# Patient Record
Sex: Female | Born: 1985 | Race: White | Hispanic: No | Marital: Married | Smoking: Current every day smoker
Health system: Southern US, Community
[De-identification: ages and names within clinical notes are randomized; demographics above are authoritative.]

## PROBLEM LIST (undated history)

## (undated) ENCOUNTER — Inpatient Hospital Stay (HOSPITAL_COMMUNITY): Payer: Self-pay

## (undated) DIAGNOSIS — F419 Anxiety disorder, unspecified: Secondary | ICD-10-CM

## (undated) DIAGNOSIS — K759 Inflammatory liver disease, unspecified: Secondary | ICD-10-CM

## (undated) DIAGNOSIS — Z8742 Personal history of other diseases of the female genital tract: Secondary | ICD-10-CM

## (undated) DIAGNOSIS — Z8619 Personal history of other infectious and parasitic diseases: Secondary | ICD-10-CM

## (undated) DIAGNOSIS — R39198 Other difficulties with micturition: Secondary | ICD-10-CM

## (undated) DIAGNOSIS — R51 Headache: Secondary | ICD-10-CM

## (undated) DIAGNOSIS — A630 Anogenital (venereal) warts: Secondary | ICD-10-CM

## (undated) DIAGNOSIS — R519 Headache, unspecified: Secondary | ICD-10-CM

## (undated) DIAGNOSIS — F1911 Other psychoactive substance abuse, in remission: Secondary | ICD-10-CM

## (undated) HISTORY — DX: Anogenital (venereal) warts: A63.0

## (undated) HISTORY — DX: Personal history of other diseases of the female genital tract: Z87.42

---

## 2013-04-22 ENCOUNTER — Emergency Department (HOSPITAL_COMMUNITY)
Admission: EM | Admit: 2013-04-22 | Discharge: 2013-04-22 | Disposition: A | Discharge status: Home / Self Care | Payer: Self-pay | Attending: Emergency Medicine | Admitting: Emergency Medicine

## 2013-04-22 ENCOUNTER — Encounter (HOSPITAL_COMMUNITY): Payer: Self-pay | Admitting: Emergency Medicine

## 2013-04-22 DIAGNOSIS — L293 Anogenital pruritus, unspecified: Secondary | ICD-10-CM | POA: Insufficient documentation

## 2013-04-22 DIAGNOSIS — Z3202 Encounter for pregnancy test, result negative: Secondary | ICD-10-CM | POA: Insufficient documentation

## 2013-04-22 DIAGNOSIS — K0889 Other specified disorders of teeth and supporting structures: Secondary | ICD-10-CM

## 2013-04-22 DIAGNOSIS — Z792 Long term (current) use of antibiotics: Secondary | ICD-10-CM | POA: Insufficient documentation

## 2013-04-22 DIAGNOSIS — Z8659 Personal history of other mental and behavioral disorders: Secondary | ICD-10-CM | POA: Insufficient documentation

## 2013-04-22 DIAGNOSIS — Z79899 Other long term (current) drug therapy: Secondary | ICD-10-CM | POA: Insufficient documentation

## 2013-04-22 DIAGNOSIS — N760 Acute vaginitis: Secondary | ICD-10-CM | POA: Insufficient documentation

## 2013-04-22 DIAGNOSIS — B9689 Other specified bacterial agents as the cause of diseases classified elsewhere: Secondary | ICD-10-CM

## 2013-04-22 DIAGNOSIS — N390 Urinary tract infection, site not specified: Secondary | ICD-10-CM | POA: Insufficient documentation

## 2013-04-22 DIAGNOSIS — F17200 Nicotine dependence, unspecified, uncomplicated: Secondary | ICD-10-CM | POA: Insufficient documentation

## 2013-04-22 DIAGNOSIS — K089 Disorder of teeth and supporting structures, unspecified: Secondary | ICD-10-CM | POA: Insufficient documentation

## 2013-04-22 DIAGNOSIS — A499 Bacterial infection, unspecified: Secondary | ICD-10-CM | POA: Insufficient documentation

## 2013-04-22 DIAGNOSIS — R300 Dysuria: Secondary | ICD-10-CM | POA: Insufficient documentation

## 2013-04-22 HISTORY — DX: Other psychoactive substance abuse, in remission: F19.11

## 2013-04-22 LAB — URINALYSIS, ROUTINE W REFLEX MICROSCOPIC
Glucose, UA: NEGATIVE mg/dL
Ketones, ur: NEGATIVE mg/dL
pH: 7.5 (ref 5.0–8.0)

## 2013-04-22 LAB — WET PREP, GENITAL: Trich, Wet Prep: NONE SEEN

## 2013-04-22 LAB — URINE MICROSCOPIC-ADD ON

## 2013-04-22 MED ORDER — PENICILLIN V POTASSIUM 500 MG PO TABS: 500.0000 mg | ORAL_TABLET | Freq: Four times a day (QID) | ORAL | Status: AC

## 2013-04-22 MED ORDER — IBUPROFEN 600 MG PO TABS: 600.0000 mg | ORAL_TABLET | Freq: Four times a day (QID) | ORAL | Status: DC | PRN

## 2013-04-22 MED ORDER — METRONIDAZOLE 500 MG PO TABS: 500.0000 mg | ORAL_TABLET | Freq: Two times a day (BID) | ORAL | Status: DC

## 2013-04-22 NOTE — ED Provider Notes (Signed)
History    CSN: [id number] Arrival date & time 04/22/13  1335  First MD Initiated Contact with Patient 04/22/13 1409     Chief Complaint  Patient presents with  . Dental Pain  . Urinary Tract Infection    burning on urination   (Consider location/radiation/quality/duration/timing/severity/associated sxs/prior Treatment) The history is provided by the patient. No language interpreter was used.  Stacey Ayala is a 27 y/o F with PMHx of substance abuse in remission presenting to the ED with dental pain, dysuria, and vaginal pruritis. Patient reported that the dental pain has been ongoing for the past 2 days - affecting the left upper tooth - stated that she cracked her tooth approximately one year ago, described the pain to be a constant pressure sensation, pain worsens when chewing, reports pain to be relieved with Ibuprofen - discomfort radiates to left cheek and neck. Patient reported that she she has been experiencing dysuria and vaginal pruritis starting yesterday. Reported that she is sexually active and that she has not used protection - stated that her last encounter was last night. Denied difficulty swallowing, sore throat, blurred vision, visual distortions, chest pain, shortness of breath, difficulty breathing, numbness to face, tingling, abdominal pain, nausea, vomiting.  PCP none   Past Medical History  Diagnosis Date  . Substance abuse in remission    History reviewed. No pertinent past surgical history. Family History  Problem Relation Age of Onset  . Diabetes Mother   . Hypertension Mother    History  Substance Use Topics  . Smoking status: Current Every Day Smoker  . Smokeless tobacco: Not on file  . Alcohol Use: No   OB History   Grav Para Term Preterm Abortions TAB SAB Ect Mult Living                 Review of Systems  Constitutional: Negative for fever and chills.  HENT: Positive for dental problem. Negative for neck pain.   Eyes: Negative for visual  disturbance.  Respiratory: Negative for chest tightness and shortness of breath.   Cardiovascular: Negative for chest pain.  Gastrointestinal: Negative for nausea, vomiting and abdominal pain.  Genitourinary: Positive for dysuria. Negative for decreased urine volume, vaginal bleeding, vaginal discharge, vaginal pain and pelvic pain.  Musculoskeletal: Negative for back pain.  Neurological: Negative for dizziness, weakness, light-headedness and headaches.  All other systems reviewed and are negative.    Allergies  Review of patient's allergies indicates no known allergies.  Home Medications   Current Outpatient Rx  Name  Route  Sig  Dispense  Refill  . ibuprofen (ADVIL,MOTRIN) 800 MG tablet   Oral   Take 800 mg by mouth every 8 (eight) hours as needed (For tooth pain.).         Marland Kitchen ibuprofen (ADVIL,MOTRIN) 600 MG tablet   Oral   Take 1 tablet (600 mg total) by mouth every 6 (six) hours as needed for pain.   30 tablet   0   . metroNIDAZOLE (FLAGYL) 500 MG tablet   Oral   Take 1 tablet (500 mg total) by mouth 2 (two) times daily. One po bid x 7 days   14 tablet   0   . penicillin v potassium (VEETID) 500 MG tablet   Oral   Take 1 tablet (500 mg total) by mouth 4 (four) times daily.   40 tablet   0    BP 119/72  Pulse 89  Temp(Src) 98 F (36.7 C) (Oral)  Resp 16  SpO2 100%  LMP 04/08/2013 Physical Exam  Nursing note and vitals reviewed. Constitutional: She is oriented to person, place, and time. She appears well-developed and well-nourished. No distress.  HENT:  Head: Normocephalic and atraumatic.  Mouth/Throat: Oropharynx is clear and moist.    Mild facial swelling noted to the left side of the face Mild discomfort noted to the left side of the face - maxillary and mandibular upon palpation  Uvula midline, symmetrical elevation.    Mouth: Negative swelling, erythema, inflammation, lesions, sores noted to the buccal mucosa and gums. Poor dentition noted -  decayed and darkened teeth to upper and lower jaw. Negative swelling to the gums. Negative active drainage. Negative abscess and cyst formation, negative trismus. Negative sublingual lesion.   Eyes: Conjunctivae and EOM are normal. Pupils are equal, round, and reactive to light. Right eye exhibits no discharge. Left eye exhibits no discharge.  Neck: Normal range of motion. Neck supple.  Negative neck stiffness Negative nuchal rigidity  Cardiovascular: Normal rate, regular rhythm and normal heart sounds.  Exam reveals no friction rub.   No murmur heard. Pulses:      Radial pulses are 2+ on the right side, and 2+ on the left side.  Pulmonary/Chest: Effort normal and breath sounds normal. No respiratory distress. She has no wheezes. She has no rales.  Genitourinary: Vagina normal.  Speculum: Negative swelling, erythema, inflammation, lesions, sores noted to the external genitalia. Negative lesions, sores, inflammation, swelling, erythema noted to the vaginal walls. Thick, white discharge noted - yeast most likely. Negative erythema, inflammation, strawberry appearance to the cervix noted.  Pelvic: Negative CMT. Negative adnexal tenderness bilaterally.   Lymphadenopathy:    She has no cervical adenopathy.  Neurological: She is alert and oriented to person, place, and time. No cranial nerve deficit. She exhibits normal muscle tone. Coordination normal.  Cranial nerves III-XII grossly intact  Skin: Skin is warm and dry. No rash noted. She is not diaphoretic. No erythema.  Psychiatric: She has a normal mood and affect. Her behavior is normal. Thought content normal.    ED Course  Procedures (including critical care time) Labs Reviewed  WET PREP, GENITAL - Abnormal; Notable for the following:    Yeast Wet Prep HPF POC FEW (*)    Clue Cells Wet Prep HPF POC MANY (*)    WBC, Wet Prep HPF POC MANY (*)    All other components within normal limits  URINALYSIS, ROUTINE W REFLEX MICROSCOPIC -  Abnormal; Notable for the following:    APPearance TURBID (*)    Leukocytes, UA SMALL (*)    All other components within normal limits  URINE MICROSCOPIC-ADD ON - Abnormal; Notable for the following:    Squamous Epithelial / LPF MANY (*)    All other components within normal limits  GC/CHLAMYDIA PROBE AMP  POCT PREGNANCY, URINE   No results found. 1. Pain, dental   2. Bacterial vaginosis     MDM  Patient presenting to the ED with dental pain x 2 days and dysuria with vaginal pruritis starting yesterday. Mild facial swelling noted to the left side of the face. Negative abscess formation, cyst formation. Negative peritonsillar abscess noted - negative posterior oropharynx swelling. Negative sublingual lesion, negative trismus - doubt Ludwig's angina. UA negative for infection. Wet prep positive for many clue cells - bacterial vaginosis. Patient stable, afebrile. Dental pain discharged with penicillin and anti-inflammatories - patient requested no narcotics due to being in remission. Bacterial vaginosis discharged with flagyl. Referred patient to  dentist, oral surgeon, women's outpatient clinic - resource guide given. Discussed with patient to rest and stay hydrated. Discussed with patient to avoid sexual encounters - recommended patient get STD testing further. Discussed with patient to use protection. Discussed with patient to continue to monitor symptoms and if symptoms are to worsen or change to report back to the ED - strict return instructions given.  Patient agreed to plan of care, understood, all questions answered.   Raymon Mutton, PA-C 04/22/13 2217

## 2013-04-22 NOTE — ED Notes (Signed)
Pain to lt side of face for the past few days with tooth pain, states that she has some swelling to lt side of face also. Denies any injury.

## 2013-04-22 NOTE — Progress Notes (Signed)
[facility] has seen patient and provided her with a list of primary care resources. °

## 2013-04-23 LAB — GC/CHLAMYDIA PROBE AMP: GC Probe RNA: NEGATIVE

## 2013-04-30 NOTE — ED Provider Notes (Signed)
Medical screening examination/treatment/procedure(s) were performed by non-physician practitioner and as supervising physician I was immediately available for consultation/collaboration.  Toy Baker, MD 04/30/13 [phone]

## 2014-08-06 ENCOUNTER — Emergency Department (HOSPITAL_COMMUNITY): Payer: Self-pay

## 2014-08-06 ENCOUNTER — Emergency Department (HOSPITAL_COMMUNITY)
Admission: EM | Admit: 2014-08-06 | Discharge: 2014-08-06 | Disposition: A | Discharge status: Home / Self Care | Payer: Self-pay | Attending: Emergency Medicine | Admitting: Emergency Medicine

## 2014-08-06 ENCOUNTER — Encounter (HOSPITAL_COMMUNITY): Payer: Self-pay | Admitting: Emergency Medicine

## 2014-08-06 DIAGNOSIS — R102 Pelvic and perineal pain: Secondary | ICD-10-CM | POA: Insufficient documentation

## 2014-08-06 DIAGNOSIS — Z720 Tobacco use: Secondary | ICD-10-CM | POA: Insufficient documentation

## 2014-08-06 DIAGNOSIS — R109 Unspecified abdominal pain: Secondary | ICD-10-CM

## 2014-08-06 DIAGNOSIS — Z3202 Encounter for pregnancy test, result negative: Secondary | ICD-10-CM | POA: Insufficient documentation

## 2014-08-06 DIAGNOSIS — Z792 Long term (current) use of antibiotics: Secondary | ICD-10-CM | POA: Insufficient documentation

## 2014-08-06 LAB — URINALYSIS, ROUTINE W REFLEX MICROSCOPIC
Bilirubin Urine: NEGATIVE
GLUCOSE, UA: NEGATIVE mg/dL
KETONES UR: NEGATIVE mg/dL
LEUKOCYTES UA: NEGATIVE
NITRITE: NEGATIVE
PH: 5.5 (ref 5.0–8.0)
Protein, ur: NEGATIVE mg/dL
SPECIFIC GRAVITY, URINE: 1.027 (ref 1.005–1.030)
Urobilinogen, UA: 0.2 mg/dL (ref 0.0–1.0)

## 2014-08-06 LAB — CBC WITH DIFFERENTIAL/PLATELET
BASOS ABS: 0 10*3/uL (ref 0.0–0.1)
BASOS PCT: 0 % (ref 0–1)
EOS ABS: 0 10*3/uL (ref 0.0–0.7)
EOS PCT: 0 % (ref 0–5)
HCT: 36.8 % (ref 36.0–46.0)
Hemoglobin: 12.6 g/dL (ref 12.0–15.0)
LYMPHS ABS: 1.6 10*3/uL (ref 0.7–4.0)
Lymphocytes Relative: 12 % (ref 12–46)
MCH: 29.9 pg (ref 26.0–34.0)
MCHC: 34.2 g/dL (ref 30.0–36.0)
MCV: 87.4 fL (ref 78.0–100.0)
Monocytes Absolute: 0.4 10*3/uL (ref 0.1–1.0)
Monocytes Relative: 3 % (ref 3–12)
NEUTROS PCT: 85 % — AB (ref 43–77)
Neutro Abs: 10.9 10*3/uL — ABNORMAL HIGH (ref 1.7–7.7)
PLATELETS: 248 10*3/uL (ref 150–400)
RBC: 4.21 MIL/uL (ref 3.87–5.11)
RDW: 12.9 % (ref 11.5–15.5)
WBC: 12.9 10*3/uL — ABNORMAL HIGH (ref 4.0–10.5)

## 2014-08-06 LAB — COMPREHENSIVE METABOLIC PANEL
ALBUMIN: 3.9 g/dL (ref 3.5–5.2)
ALK PHOS: 44 U/L (ref 39–117)
ALT: 15 U/L (ref 0–35)
AST: 18 U/L (ref 0–37)
Anion gap: 12 (ref 5–15)
BUN: 14 mg/dL (ref 6–23)
CALCIUM: 8.9 mg/dL (ref 8.4–10.5)
CO2: 21 mEq/L (ref 19–32)
Chloride: 105 mEq/L (ref 96–112)
Creatinine, Ser: 0.77 mg/dL (ref 0.50–1.10)
GFR calc non Af Amer: >90 mL/min (ref >90)
Glucose, Bld: 89 mg/dL (ref 70–99)
POTASSIUM: 4.3 meq/L (ref 3.7–5.3)
SODIUM: 138 meq/L (ref 137–147)
TOTAL PROTEIN: 7.2 g/dL (ref 6.0–8.3)
Total Bilirubin: 0.4 mg/dL (ref 0.3–1.2)

## 2014-08-06 LAB — URINE MICROSCOPIC-ADD ON

## 2014-08-06 LAB — LIPASE, BLOOD: Lipase: 18 U/L (ref 11–59)

## 2014-08-06 LAB — WET PREP, GENITAL
CLUE CELLS WET PREP: NONE SEEN
Trich, Wet Prep: NONE SEEN
YEAST WET PREP: NONE SEEN

## 2014-08-06 LAB — POC URINE PREG, ED: PREG TEST UR: NEGATIVE

## 2014-08-06 MED ORDER — MORPHINE SULFATE 4 MG/ML IJ SOLN
4.0000 mg | Freq: Once | INTRAMUSCULAR | Status: DC
  Filled 2014-08-06: qty 1

## 2014-08-06 MED ORDER — MORPHINE SULFATE 4 MG/ML IJ SOLN
4.0000 mg | Freq: Once | INTRAMUSCULAR | Status: AC
  Administered 2014-08-06: 4 mg via INTRAMUSCULAR
  Filled 2014-08-06: qty 1

## 2014-08-06 MED ORDER — IBUPROFEN 800 MG PO TABS: 800.0000 mg | ORAL_TABLET | Freq: Three times a day (TID) | ORAL | Status: DC | PRN

## 2014-08-06 MED ORDER — ONDANSETRON HCL 4 MG PO TABS: 4.0000 mg | ORAL_TABLET | Freq: Four times a day (QID) | ORAL | Status: DC

## 2014-08-06 MED ORDER — NICOTINE 21 MG/24HR TD PT24
21.0000 mg | MEDICATED_PATCH | Freq: Once | TRANSDERMAL | Status: DC
  Administered 2014-08-06: 21 mg via TRANSDERMAL
  Filled 2014-08-06: qty 1

## 2014-08-06 MED ORDER — ONDANSETRON 4 MG PO TBDP
4.0000 mg | ORAL_TABLET | Freq: Once | ORAL | Status: AC
  Administered 2014-08-06: 4 mg via ORAL
  Filled 2014-08-06: qty 1

## 2014-08-06 MED ORDER — DICYCLOMINE HCL 20 MG PO TABS: 20.0000 mg | ORAL_TABLET | Freq: Two times a day (BID) | ORAL | Status: DC

## 2014-08-06 MED ORDER — MORPHINE SULFATE 4 MG/ML IJ SOLN: 4.0000 mg | Freq: Once | INTRAMUSCULAR | Status: DC

## 2014-08-06 MED ORDER — IOHEXOL 300 MG/ML  SOLN
25.0000 mL | Freq: Once | INTRAMUSCULAR | Status: AC | PRN
  Administered 2014-08-06: 25 mL via ORAL

## 2014-08-06 MED ORDER — KETOROLAC TROMETHAMINE 30 MG/ML IJ SOLN
60.0000 mg | Freq: Once | INTRAMUSCULAR | Status: AC
  Administered 2014-08-06: 60 mg via INTRAMUSCULAR
  Filled 2014-08-06: qty 2

## 2014-08-06 MED ORDER — SODIUM CHLORIDE 0.9 % IV BOLUS (SEPSIS): 1000.0000 mL | Freq: Once | INTRAVENOUS | Status: DC

## 2014-08-06 NOTE — ED Notes (Signed)
ermd attempted iv by us to rt ac without success and to rt ej   Without success ct tech called and informed no iv line

## 2014-08-06 NOTE — ED Notes (Signed)
Pt requesting addl pain med

## 2014-08-06 NOTE — ED Notes (Signed)
Stacey Ayala states she will insert ultrasound iv line

## 2014-08-06 NOTE — ED Notes (Signed)
Left sided abd pain since 4 30 am and has vomited multiple times on her period now denies vagd d/c and dysuria

## 2014-08-06 NOTE — ED Notes (Signed)
Attempted iv to rt ac with 20g unable to advance cath dcd intact and dsd to site

## 2014-08-06 NOTE — ED Notes (Signed)
Pt requesting pain med md informed writer offered pt tylenol or tramadol as md requested and pt stated "i want something stronger. i talked to my sponsor and they said i could have something in the er but not go home with it. i'm not looking to get high i just hurt and need something stronger" dr.ward informed and will speak with pt

## 2014-08-06 NOTE — ED Notes (Signed)
pt remains in us

## 2014-08-06 NOTE — ED Provider Notes (Signed)
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 She reports she is currently on her menstrual cycle. She has never had similar pain before. Denies any fevers or chills but also has had some diarrhea. No dysuria or vaginal discharge. No prior history of abdominal surgery. No sick contacts or recent travel. She is sexually active with one partner and denies a history of prior STDs. She denies being pregnant before.  ROS: See HPI Constitutional: no fever  Eyes: no drainage  ENT: no runny nose   Cardiovascular:  no chest pain  Resp: no SOB  GI:  vomiting GU: no dysuria Integumentary: no rash  Allergy: no hives  Musculoskeletal: no leg swelling  Neurological: no slurred speech ROS otherwise negative  PAST MEDICAL HISTORY/PAST SURGICAL HISTORY:  Past Medical History  Diagnosis Date  . Substance abuse in remission     MEDICATIONS:  Prior to Admission medications   Medication Sig Start Date End Date Taking? Authorizing Provider  ibuprofen (ADVIL,MOTRIN) 600 MG tablet Take 1 tablet (600 mg total) by mouth every 6 (six) hours as needed for pain. 04/22/13   Marissa Sciacca, PA-C  ibuprofen (ADVIL,MOTRIN) 800 MG tablet Take 800 mg by mouth every 8 (eight) hours as needed (For tooth pain.).    Historical Provider, MD  metroNIDAZOLE (FLAGYL) 500 MG tablet Take 1 tablet (500 mg total) by mouth 2 (two) times daily. One po bid x 7 days 04/22/13   Raymon MuttonMarissa Sciacca, PA-C    ALLERGIES:  No Known Allergies  SOCIAL HISTORY:  History  Substance Use Topics  . Smoking status: Current Every Day Smoker  . Smokeless tobacco: Not on file  . Alcohol Use: No    FAMILY HISTORY: Family History  Problem Relation Age of Onset  . Diabetes Mother    . Hypertension Mother     EXAM: BP 112/56 mmHg  Pulse 77  Temp(Src) 97.8 F (36.6 C) (Oral)  Resp 16  SpO2 99% CONSTITUTIONAL: Alert and oriented and responds appropriately to questions. Well-appearing; well-nourished HEAD: Normocephalic EYES: Conjunctivae clear, PERRL ENT: normal nose; no rhinorrhea; moist mucous membranes; pharynx without lesions noted NECK: Supple, no meningismus, no LAD  CARD: RRR; S1 and S2 appreciated; no murmurs, no clicks, no rubs, no gallops RESP: Normal chest excursion without splinting or tachypnea; breath sounds clear and equal bilaterally; no wheezes, no rhonchi, no rales,  ABD/GI: Normal bowel sounds; non-distended; soft, tender to palpation in the left lower pelvic region without guarding or rebound, no peritoneal signs GU:  Normal external genitalia, mild amount of dark red blood coming from the cervical os, no vaginal discharge, no right adnexal tenderness or fullness, no cervical motion tenderness, patient does have left adnexal tenderness without fullness BACK:  The back appears normal and is non-tender to palpation, there is no CVA tenderness EXT: Normal ROM in all joints; non-tender to palpation; no edema; normal capillary refill; no cyanosis    SKIN: Normal color for age and race; warm NEURO: Moves all extremities equally PSYCH: The patient's mood and manner are appropriate. Grooming and personal hygiene are appropriate.  MEDICAL DECISION MAKING: Pt here with left pelvic pain. Differential diagnosis includes ovarian cyst, torsion, TOA. Pregnancy test is negative so I doubt that this is an  ectopic. Less likely colitis. Gastroenteritis is also the differential given her vomiting and diarrhea. Given she is a prior heroin addict, she is requesting that we try to avoid narcotic medications. We'll give IM Toradol. We'll give oral Zofran. We'll obtain labs, urinalysis, pelvic exam with cultures and transvaginal Doppler.  ED PROGRESS: Pt's wet prep shows  numerous white blood cells but no clue cells, yeast or trichomonas. Gonorrhea and chlamydia cultures are pending. She is not concerned for STD exposure and I do not feel she needs prophylactic treatment based on her exam. She is complaining of worsening pain and does have a leukocytosis with left shift. On repeat examination, she now has right lower quadrant pain with some voluntary guarding. Concern for possible appendicitis. Her transvaginal ultrasound has been normal. We'll proceed with a CT of her abdomen and pelvis. Patient is requesting something stronger for pain. She reports she has talked to her sponsor who she is on the phone with my [employer] and they state that they feel it is okay for her to have something for pain control in the emergency department as long as she is not discharged with any narcotic. We have discussed this at length at bedside and given patient appears uncomfortable I feel is appropriate to treat her pain in the ED.   4:00 PM  Pt's CT shows no acute abnormality to explain her pain. She does have a small pulmonary nodule that will need follow-up in one year. Discussed this with patient. We'll discharge her home with prescription for Zofran, Bentyl, ibuprofen. Possible gastroenteritis. Discussed return precautions. Patient comfortable with plan.  Layla MawKristen N Lawsyn Heiler, DO 08/06/14 1601

## 2014-08-06 NOTE — Discharge Instructions (Signed)
You have a 7.5 mm left-sided pulmonary nodule that needs to be followed by imaging every 12 months.    Viral Gastroenteritis Viral gastroenteritis is also known as stomach flu. This condition affects the stomach and intestinal tract. It can cause sudden diarrhea and vomiting. The illness typically lasts 3 to 8 days. Most people develop an immune response that eventually gets rid of the virus. While this natural response develops, the virus can make you quite ill. CAUSES  Many different viruses can cause gastroenteritis, such as rotavirus or noroviruses. You can catch one of these viruses by consuming contaminated food or water. You may also catch a virus by sharing utensils or other personal items with an infected person or by touching a contaminated surface. SYMPTOMS  The most common symptoms are diarrhea and vomiting. These problems can cause a severe loss of body fluids (dehydration) and a body salt (electrolyte) imbalance. Other symptoms may include:  Fever.  Headache.  Fatigue.  Abdominal pain. DIAGNOSIS  Your caregiver can usually diagnose viral gastroenteritis based on your symptoms and a physical exam. A stool sample may also be taken to test for the presence of viruses or other infections. TREATMENT  This illness typically goes away on its own. Treatments are aimed at rehydration. The most serious cases of viral gastroenteritis involve vomiting so severely that you are not able to keep fluids down. In these cases, fluids must be given through an intravenous line (IV). HOME CARE INSTRUCTIONS   Drink enough fluids to keep your urine clear or pale yellow. Drink small amounts of fluids frequently and increase the amounts as tolerated.  Ask your caregiver for specific rehydration instructions.  Avoid:  Foods high in sugar.  Alcohol.  Carbonated drinks.  Tobacco.  Juice.  Caffeine drinks.  Extremely hot or cold fluids.  Fatty, greasy foods.  Too much intake of  anything at one time.  Dairy products until 24 to 48 hours after diarrhea stops.  You may consume probiotics. Probiotics are active cultures of beneficial bacteria. They may lessen the amount and number of diarrheal stools in adults. Probiotics can be found in yogurt with active cultures and in supplements.  Wash your hands well to avoid spreading the virus.  Only take over-the-counter or prescription medicines for pain, discomfort, or fever as directed by your caregiver. Do not give aspirin to children. Antidiarrheal medicines are not recommended.  Ask your caregiver if you should continue to take your regular prescribed and over-the-counter medicines.  Keep all follow-up appointments as directed by your caregiver. SEEK IMMEDIATE MEDICAL CARE IF:   You are unable to keep fluids down.  You do not urinate at least once every 6 to 8 hours.  You develop shortness of breath.  You notice blood in your stool or vomit. This may look like coffee grounds.  You have abdominal pain that increases or is concentrated in one small area (localized).  You have persistent vomiting or diarrhea.  You have a fever.  The patient is a child younger than 3 months, and he or she has a fever.  The patient is a child older than 3 months, and he or she has a fever and persistent symptoms.  The patient is a child older than 3 months, and he or she has a fever and symptoms suddenly get worse.  The patient is a baby, and he or she has no tears when crying. MAKE SURE YOU:   Understand these instructions.  Will watch your condition.  Will get  help right away if you are not doing well or get worse. Document Released: 09/18/2005 Document Revised: 12/11/2011 Document Reviewed: 07/05/2011 [facility] Patient Information 2015 [place], [place]. This information is not intended to replace advice given to you by your health care provider. Make sure you discuss any questions you have with your health care  provider.   Pulmonary Nodule A pulmonary nodule is a small, round growth of tissue in the lung. Pulmonary nodules can range in size from less than 1/5 inch (4 mm) to a little bigger than an inch (25 mm). Most pulmonary nodules are detected when imaging tests of the lung are being performed for a different problem. Pulmonary nodules are usually not cancerous (benign). However, some pulmonary nodules are cancerous (malignant). Follow-up treatment or testing is based on the size of the pulmonary nodule and your risk of getting lung cancer.  CAUSES Benign pulmonary nodules can be caused by various things. Some of the causes include:   Bacterial, fungal, or viral infections. This is usually an old infection that is no longer active, but it can sometimes be a current, active infection.  A benign mass of tissue.  Inflammation from conditions such as rheumatoid arthritis.   Abnormal blood vessels in the lungs. Malignant pulmonary nodules can result from lung cancer or from cancers that spread to the lung from other places in the body. SIGNS AND SYMPTOMS Pulmonary nodules usually do not cause symptoms. DIAGNOSIS Most often, pulmonary nodules are found incidentally when an X-ray or CT scan is performed to look for some other problem in the lung area. To help determine whether a pulmonary nodule is benign or malignant, your health care provider will take a medical history and order a variety of tests. Tests done may include:   Blood tests.  A skin test called a tuberculin test. This test is used to determine if you have been exposed to the germ that causes tuberculosis.   Chest X-rays. If possible, a new X-ray may be compared with X-rays you have had in the past.   CT scan. This test shows smaller pulmonary nodules more clearly than an X-ray.   Positron emission tomography (PET) scan. In this test, a safe amount of a radioactive substance is injected into the bloodstream. Then, the scan takes  a picture of the pulmonary nodule. The radioactive substance is eliminated from your body in your urine.   Biopsy. A tiny piece of the pulmonary nodule is removed so it can be checked under a microscope. TREATMENT  Pulmonary nodules that are benign normally do not require any treatment because they usually do not cause symptoms or breathing problems. Your health care provider may want to monitor the pulmonary nodule through follow-up CT scans. The frequency of these CT scans will vary based on the size of the nodule and the risk factors for lung cancer. For example, CT scans will need to be done more frequently if the pulmonary nodule is larger and if you have a history of smoking and a family history of cancer. Further testing or biopsies may be done if any follow-up CT scan shows that the size of the pulmonary nodule has increased. HOME CARE INSTRUCTIONS  Only take over-the-counter or prescription medicines as directed by your health care provider.  Keep all follow-up appointments with your health care provider. SEEK MEDICAL CARE IF:  You have trouble breathing when you are active.   You feel sick or unusually tired.   You do not feel like eating.  You lose weight without trying to.   You develop chills or night sweats.  SEEK IMMEDIATE MEDICAL CARE IF:  You cannot catch your breath, or you begin wheezing.   You cannot stop coughing.   You cough up blood.   You become dizzy or feel like you are going to pass out.   You have sudden chest pain.   You have a fever or persistent symptoms for more than 2-3 days.   You have a fever and your symptoms suddenly get worse. MAKE SURE YOU:  Understand these instructions.  Will watch your condition.  Will get help right away if you are not doing well or get worse. Document Released: 07/16/2009 Document Revised: 05/21/2013 Document Reviewed: 03/10/2013 [facility] Patient Information 2015 [place]. This information is  not intended to replace advice given to you by your health care provider. Make sure you discuss any questions you have with your health care provider.

## 2014-08-07 LAB — GC/CHLAMYDIA PROBE AMP
CT Probe RNA: NEGATIVE
GC Probe RNA: NEGATIVE

## 2014-10-11 ENCOUNTER — Emergency Department (HOSPITAL_COMMUNITY)
Admission: EM | Admit: 2014-10-11 | Discharge: 2014-10-11 | Disposition: A | Discharge status: Home / Self Care | Payer: Self-pay | Attending: Emergency Medicine | Admitting: Emergency Medicine

## 2014-10-11 ENCOUNTER — Encounter (HOSPITAL_COMMUNITY): Payer: Self-pay | Admitting: *Deleted

## 2014-10-11 DIAGNOSIS — Z79899 Other long term (current) drug therapy: Secondary | ICD-10-CM | POA: Insufficient documentation

## 2014-10-11 DIAGNOSIS — R1084 Generalized abdominal pain: Secondary | ICD-10-CM

## 2014-10-11 DIAGNOSIS — Z792 Long term (current) use of antibiotics: Secondary | ICD-10-CM | POA: Insufficient documentation

## 2014-10-11 DIAGNOSIS — F419 Anxiety disorder, unspecified: Secondary | ICD-10-CM | POA: Insufficient documentation

## 2014-10-11 DIAGNOSIS — N390 Urinary tract infection, site not specified: Secondary | ICD-10-CM | POA: Insufficient documentation

## 2014-10-11 DIAGNOSIS — Z720 Tobacco use: Secondary | ICD-10-CM | POA: Insufficient documentation

## 2014-10-11 DIAGNOSIS — R197 Diarrhea, unspecified: Secondary | ICD-10-CM | POA: Insufficient documentation

## 2014-10-11 DIAGNOSIS — Z3202 Encounter for pregnancy test, result negative: Secondary | ICD-10-CM | POA: Insufficient documentation

## 2014-10-11 HISTORY — DX: Anxiety disorder, unspecified: F41.9

## 2014-10-11 LAB — URINALYSIS, ROUTINE W REFLEX MICROSCOPIC
GLUCOSE, UA: NEGATIVE mg/dL
Hgb urine dipstick: NEGATIVE
Ketones, ur: 15 mg/dL — AB
Nitrite: POSITIVE — AB
PROTEIN: NEGATIVE mg/dL
Specific Gravity, Urine: 1.019 (ref 1.005–1.030)
UROBILINOGEN UA: 4 mg/dL — AB (ref 0.0–1.0)
pH: 5 (ref 5.0–8.0)

## 2014-10-11 LAB — COMPREHENSIVE METABOLIC PANEL
ALBUMIN: 4.5 g/dL (ref 3.5–5.2)
ALT: 24 U/L (ref 0–35)
AST: 23 U/L (ref 0–37)
Alkaline Phosphatase: 39 U/L (ref 39–117)
Anion gap: 8 (ref 5–15)
BUN: 12 mg/dL (ref 6–23)
CO2: 23 mmol/L (ref 19–32)
CREATININE: 0.83 mg/dL (ref 0.50–1.10)
Calcium: 9.3 mg/dL (ref 8.4–10.5)
Chloride: 106 mEq/L (ref 96–112)
GFR calc non Af Amer: >90 mL/min (ref >90)
GLUCOSE: 82 mg/dL (ref 70–99)
POTASSIUM: 4.3 mmol/L (ref 3.5–5.1)
SODIUM: 137 mmol/L (ref 135–145)
Total Bilirubin: 0.8 mg/dL (ref 0.3–1.2)
Total Protein: 7.6 g/dL (ref 6.0–8.3)

## 2014-10-11 LAB — CBC WITH DIFFERENTIAL/PLATELET
Basophils Absolute: 0 10*3/uL (ref 0.0–0.1)
Basophils Relative: 0 % (ref 0–1)
EOS PCT: 2 % (ref 0–5)
Eosinophils Absolute: 0.2 10*3/uL (ref 0.0–0.7)
HCT: 38.2 % (ref 36.0–46.0)
Hemoglobin: 13.2 g/dL (ref 12.0–15.0)
Lymphocytes Relative: 23 % (ref 12–46)
Lymphs Abs: 2.5 10*3/uL (ref 0.7–4.0)
MCH: 30.2 pg (ref 26.0–34.0)
MCHC: 34.6 g/dL (ref 30.0–36.0)
MCV: 87.4 fL (ref 78.0–100.0)
MONO ABS: 0.9 10*3/uL (ref 0.1–1.0)
MONOS PCT: 9 % (ref 3–12)
NEUTROS ABS: 7.2 10*3/uL (ref 1.7–7.7)
Neutrophils Relative %: 66 % (ref 43–77)
Platelets: 249 10*3/uL (ref 150–400)
RBC: 4.37 MIL/uL (ref 3.87–5.11)
RDW: 12.5 % (ref 11.5–15.5)
WBC: 10.8 10*3/uL — AB (ref 4.0–10.5)

## 2014-10-11 LAB — URINE MICROSCOPIC-ADD ON

## 2014-10-11 LAB — LIPASE, BLOOD: LIPASE: 22 U/L (ref 11–59)

## 2014-10-11 LAB — POC URINE PREG, ED: Preg Test, Ur: NEGATIVE

## 2014-10-11 MED ORDER — DIPHENOXYLATE-ATROPINE 2.5-0.025 MG PO TABS: 1.0000 | ORAL_TABLET | Freq: Four times a day (QID) | ORAL | Status: DC | PRN

## 2014-10-11 MED ORDER — CIPROFLOXACIN HCL 500 MG PO TABS: 500.0000 mg | ORAL_TABLET | Freq: Two times a day (BID) | ORAL | Status: DC

## 2014-10-11 MED ORDER — KETOROLAC TROMETHAMINE 30 MG/ML IJ SOLN
30.0000 mg | Freq: Once | INTRAMUSCULAR | Status: AC
  Administered 2014-10-11: 30 mg via INTRAVENOUS
  Filled 2014-10-11: qty 1

## 2014-10-11 MED ORDER — CIPROFLOXACIN HCL 500 MG PO TABS
500.0000 mg | ORAL_TABLET | Freq: Once | ORAL | Status: AC
  Administered 2014-10-11: 500 mg via ORAL
  Filled 2014-10-11: qty 1

## 2014-10-11 MED ORDER — MORPHINE SULFATE 4 MG/ML IJ SOLN
4.0000 mg | INTRAMUSCULAR | Status: DC | PRN
  Administered 2014-10-11: 4 mg via INTRAVENOUS
  Filled 2014-10-11: qty 1

## 2014-10-11 MED ORDER — DIPHENOXYLATE-ATROPINE 2.5-0.025 MG PO TABS
2.0000 | ORAL_TABLET | Freq: Once | ORAL | Status: AC
  Administered 2014-10-11: 2 via ORAL
  Filled 2014-10-11: qty 2

## 2014-10-11 MED ORDER — ONDANSETRON HCL 4 MG/2ML IJ SOLN
4.0000 mg | Freq: Once | INTRAMUSCULAR | Status: AC
  Administered 2014-10-11: 4 mg via INTRAVENOUS
  Filled 2014-10-11: qty 2

## 2014-10-11 MED ORDER — DICYCLOMINE HCL 20 MG PO TABS: 20.0000 mg | ORAL_TABLET | Freq: Two times a day (BID) | ORAL | Status: DC

## 2014-10-11 NOTE — Discharge Instructions (Signed)
Abdominal Pain, Women °Abdominal (stomach, pelvic, or belly) pain can be caused by many things. It is important to tell your doctor: °· The location of the pain. °· Does it come and go or is it present all the time? °· Are there things that start the pain (eating certain foods, exercise)? °· Are there other symptoms associated with the pain (fever, nausea, vomiting, diarrhea)? °All of this is helpful to know when trying to find the cause of the pain. °CAUSES  °· Stomach: virus or bacteria infection, or ulcer. °· Intestine: appendicitis (inflamed appendix), regional ileitis (Crohn's disease), ulcerative colitis (inflamed colon), irritable bowel syndrome, diverticulitis (inflamed diverticulum of the colon), or cancer of the stomach or intestine. °· Gallbladder disease or stones in the gallbladder. °· Kidney disease, kidney stones, or infection. °· Pancreas infection or cancer. °· Fibromyalgia (pain disorder). °· Diseases of the female organs: °¨ Uterus: fibroid (non-cancerous) tumors or infection. °¨ Fallopian tubes: infection or tubal pregnancy. °¨ Ovary: cysts or tumors. °¨ Pelvic adhesions (scar tissue). °¨ Endometriosis (uterus lining tissue growing in the pelvis and on the pelvic organs). °¨ Pelvic congestion syndrome (female organs filling up with blood just before the menstrual period). °¨ Pain with the menstrual period. °¨ Pain with ovulation (producing an egg). °¨ Pain with an IUD (intrauterine device, birth control) in the uterus. °¨ Cancer of the female organs. °· Functional pain (pain not caused by a disease, may improve without treatment). °· Psychological pain. °· Depression. °DIAGNOSIS  °Your doctor will decide the seriousness of your pain by doing an examination. °· Blood tests. °· X-rays. °· Ultrasound. °· CT scan (computed tomography, special type of X-ray). °· MRI (magnetic resonance imaging). °· Cultures, for infection. °· Barium enema (dye inserted in the large intestine, to better view it with  X-rays). °· Colonoscopy (looking in intestine with a lighted tube). °· Laparoscopy (minor surgery, looking in abdomen with a lighted tube). °· Major abdominal exploratory surgery (looking in abdomen with a large incision). °TREATMENT  °The treatment will depend on the cause of the pain.  °· Many cases can be observed and treated at home. °· Over-the-counter medicines recommended by your caregiver. °· Prescription medicine. °· Antibiotics, for infection. °· Birth control pills, for painful periods or for ovulation pain. °· Hormone treatment, for endometriosis. °· Nerve blocking injections. °· Physical therapy. °· Antidepressants. °· Counseling with a psychologist or psychiatrist. °· Minor or major surgery. °HOME CARE INSTRUCTIONS  °· Do not take laxatives, unless directed by your caregiver. °· Take over-the-counter pain medicine only if ordered by your caregiver. Do not take aspirin because it can cause an upset stomach or bleeding. °· Try a clear liquid diet (broth or water) as ordered by your caregiver. Slowly move to a bland diet, as tolerated, if the pain is related to the stomach or intestine. °· Have a thermometer and take your temperature several times a day, and record it. °· Bed rest and sleep, if it helps the pain. °· Avoid sexual intercourse, if it causes pain. °· Avoid stressful situations. °· Keep your follow-up appointments and tests, as your caregiver orders. °· If the pain does not go away with medicine or surgery, you may try: °¨ Acupuncture. °¨ Relaxation exercises (yoga, meditation). °¨ Group therapy. °¨ Counseling. °SEEK MEDICAL CARE IF:  °· You notice certain foods cause stomach pain. °· Your home care treatment is not helping your pain. °· You need stronger pain medicine. °· You want your IUD removed. °· You feel faint or   lightheaded. °· You develop nausea and vomiting. °· You develop a rash. °· You are having side effects or an allergy to your medicine. °SEEK IMMEDIATE MEDICAL CARE IF:  °· Your  pain does not go away or gets worse. °· You have a fever. °· Your pain is felt only in portions of the abdomen. The right side could possibly be appendicitis. The left lower portion of the abdomen could be colitis or diverticulitis. °· You are passing blood in your stools (bright red or black tarry stools, with or without vomiting). °· You have blood in your urine. °· You develop chills, with or without a fever. °· You pass out. °MAKE SURE YOU:  °· Understand these instructions. °· Will watch your condition. °· Will get help right away if you are not doing well or get worse. °Document Released: 07/16/2007 Document Revised: 02/02/2014 Document Reviewed: 08/05/2009 °Exit[employer]® Patient Information ©2015 [employer]. This information is not intended to replace advice given to you by your health care provider. Make sure you discuss any questions you have with your health care provider. ° °

## 2014-10-11 NOTE — ED Notes (Signed)
Pts suprapubic pain and diarrhea all day today. AZO and pyridium without relief.

## 2014-10-11 NOTE — ED Provider Notes (Signed)
CSN: [id number]     Arrival date & time 10/11/14  1828 History   First MD Initiated Contact with Patient 10/11/14 1920     Chief Complaint  Patient presents with  . Dysuria  . Diarrhea      HPI  Differential evaluation of abdominal pain diarrhea and "I think I got another bladder infection". 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 Colicky lower abdominal pain today with diarrhea. Also urinary frequency starting early this morning. This awakened her from bed. She is taking cranberry tablets at home. States she's drank "several liters" of water. He continues to dysuria and frequency. Diarrhea "just water". No blood. No pus or mucus. No nausea vomiting.  Past Medical History  Diagnosis Date  . Substance abuse in remission   . Anxiety    History reviewed. No pertinent past surgical history. Family History  Problem Relation Age of Onset  . Diabetes Mother   . Hypertension Mother    History  Substance Use Topics  . Smoking status: Current Every Day Smoker  . Smokeless tobacco: Not on file  . Alcohol Use: No   OB History    No data available     Review of Systems  Constitutional: Negative for fever, chills, diaphoresis, appetite change and fatigue.  HENT: Negative for mouth sores, sore throat and trouble swallowing.   Eyes: Negative for visual disturbance.  Respiratory: Negative for cough, chest tightness, shortness of breath and wheezing.   Cardiovascular: Negative for chest pain.  Gastrointestinal: Positive for nausea, abdominal pain and diarrhea. Negative for vomiting and abdominal distention.  Endocrine: Negative for polydipsia, polyphagia and polyuria.  Genitourinary: Positive for dysuria, urgency and frequency. Negative for hematuria.  Musculoskeletal: Negative for gait problem.  Skin: Negative for color change, pallor and rash.  Neurological: Negative for dizziness, syncope, light-headedness and headaches.  Hematological: Does not  bruise/bleed easily.  Psychiatric/Behavioral: Negative for behavioral problems and confusion.      Allergies  Review of patient's allergies indicates no known allergies.  Home Medications   Prior to Admission medications   Medication Sig Start Date End Date Taking? Authorizing Provider  clonazePAM (KLONOPIN) 0.5 MG tablet Take 0.5 mg by mouth 2 (two) times daily.   Yes Historical Provider, MD  Cranberry-Vitamin C-Probiotic (AZO CRANBERRY PO) Take 2 tablets by mouth daily as needed (difficulty urinating).   Yes Historical Provider, MD  ibuprofen (ADVIL,MOTRIN) 200 MG tablet Take 800 mg by mouth every 6 (six) hours as needed for moderate pain (cramps).   Yes Historical Provider, MD  phenazopyridine (PYRIDIUM) 200 MG tablet Take 200 mg by mouth 3 (three) times daily as needed for pain (difficulty urinating).   Yes Historical Provider, MD  Sulfamethoxazole-Trimethoprim (SULFAMETHOXAZOLE-TMP DS PO) Take 1 tablet by mouth daily as needed (diffuculty urinating).   Yes Historical Provider, MD  ciprofloxacin (CIPRO) 500 MG tablet Take 1 tablet (500 mg total) by mouth every 12 (twelve) hours. 10/11/14   Rolland PorterMark Aleighna Wojtas, MD  dicyclomine (BENTYL) 20 MG tablet Take 1 tablet (20 mg total) by mouth 2 (two) times daily. 10/11/14   Rolland PorterMark Pietra Zuluaga, MD  diphenoxylate-atropine (LOMOTIL) 2.5-0.025 MG per tablet Take 1 tablet by mouth 4 (four) times daily as needed for diarrhea or loose stools. 10/11/14   Rolland PorterMark Neah Sporrer, MD  ibuprofen (ADVIL,MOTRIN) 800 MG tablet Take 1 tablet (800 mg total) by mouth every 8 (eight) hours as needed. 08/06/14   Kristen N Ward, DO  metroNIDAZOLE (FLAGYL) 500 MG tablet Take 1  tablet (500 mg total) by mouth 2 (two) times daily. One po bid x 7 days Patient not taking: Reported on 10/11/2014 04/22/13   Marissa Sciacca, PA-C  ondansetron (ZOFRAN) 4 MG tablet Take 1 tablet (4 mg total) by mouth every 6 (six) hours. Patient not taking: Reported on 10/11/2014 08/06/14   Kristen N Ward, DO   BP 135/81 mmHg   Pulse 111  Temp(Src) 98.4 F (36.9 C) (Oral)  Resp 18  SpO2 96%  LMP 09/26/2014 Physical Exam  Constitutional: She is oriented to person, place, and time. She appears well-developed and well-nourished. No distress.  HENT:  Head: Normocephalic.  Eyes: Conjunctivae are normal. Pupils are equal, round, and reactive to light. No scleral icterus.  Neck: Normal range of motion. Neck supple. No thyromegaly present.  Cardiovascular: Normal rate and regular rhythm.  Exam reveals no gallop and no friction rub.   No murmur heard. Pulmonary/Chest: Effort normal and breath sounds normal. No respiratory distress. She has no wheezes. She has no rales.  Abdominal: Soft. Bowel sounds are normal. She exhibits no distension. There is no tenderness. There is no rebound.  Reports generalized lower abdominal pain. However not tender over the right lower quadrant or in the area of the appendix. No guarding rebound or peritoneal irritation.  Musculoskeletal: Normal range of motion.  Neurological: She is alert and oriented to person, place, and time.  Skin: Skin is warm and dry. No rash noted.  Psychiatric: She has a normal mood and affect. Her behavior is normal.    ED Course  Procedures (including critical care time) Labs Review Labs Reviewed  CBC WITH DIFFERENTIAL - Abnormal; Notable for the following:    WBC 10.8 (*)    All other components within normal limits  URINALYSIS, ROUTINE W REFLEX MICROSCOPIC - Abnormal; Notable for the following:    Color, Urine RED (*)    Bilirubin Urine SMALL (*)    Ketones, ur 15 (*)    Urobilinogen, UA 4.0 (*)    Nitrite POSITIVE (*)    Leukocytes, UA MODERATE (*)    All other components within normal limits  URINE CULTURE  COMPREHENSIVE METABOLIC PANEL  LIPASE, BLOOD  URINE MICROSCOPIC-ADD ON  POC URINE PREG, ED    Imaging Review No results found.   EKG Interpretation None      MDM   Final diagnoses:  Generalized abdominal pain  UTI (lower urinary  tract infection)  Diarrhea    Physical and abdominal exam. Symptoms sound completely consistent with UTI with dysuria, infrequency. Bedside ultrasound shows empty bladder. Minimal white blood cells, positive bacteria and nitrites. Culture pending. His given Lomotil for the diarrhea. By mouth Cipro. 1 dose morphine and Zofran for pain. She finds pelvic exam. She denies discharge or bleeding. Plan is home.  Primary care follow-up. Return here with any worsening symptoms. In particular did address with her that she develops any localizing abdominal pain particularly right lower quadrant pain and recheck here. Exam shows no tenderness to examine. She is ambulatory without pain.    Rolland Porter, MD 10/11/14 2117

## 2014-10-12 LAB — URINE CULTURE
CULTURE: NO GROWTH
Colony Count: NO GROWTH

## 2015-10-01 IMAGING — US US TRANSVAGINAL NON-OB
1 series · 13 of 25 positions shown · non-contrast
Comparison: None.

CLINICAL DATA: Left pelvic pain.  Concern for ovarian torsion

EXAM:
TRANSABDOMINAL AND TRANSVAGINAL ULTRASOUND OF PELVIS
DOPPLER ULTRASOUND OF OVARIES
TECHNIQUE: Both transabdominal and transvaginal ultrasound examinations of the
pelvis were performed. Transabdominal technique was performed for
global imaging of the pelvis including uterus, ovaries, adnexal
regions, and pelvic cul-de-sac.
It was necessary to proceed with endovaginal exam following the
transabdominal exam to visualize the ovaries. Color and duplex
Doppler ultrasound was utilized to evaluate blood flow to the
ovaries.

[Series 1: us transvaginal non-ob · 0.20mm/px · 13 of 71 slices shown]
[im 1/71]
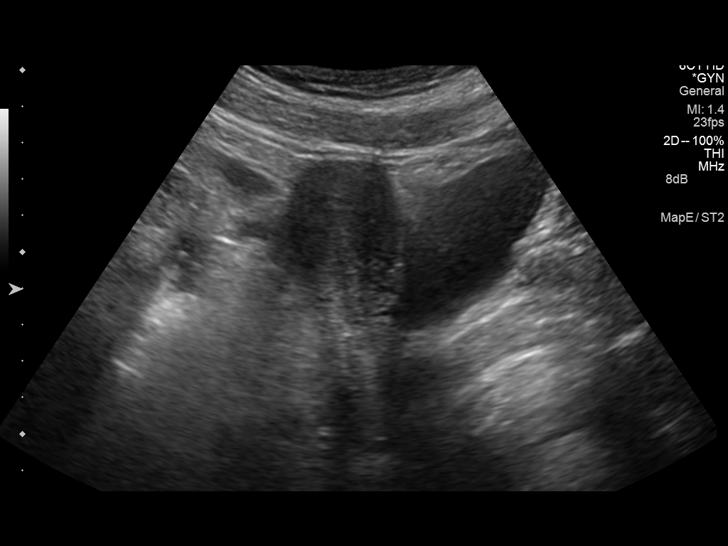
[im 6/71]
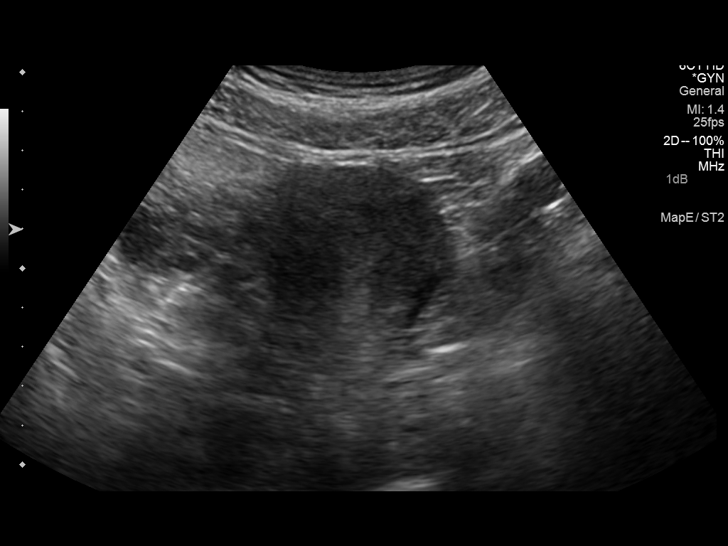
[im 12/71]
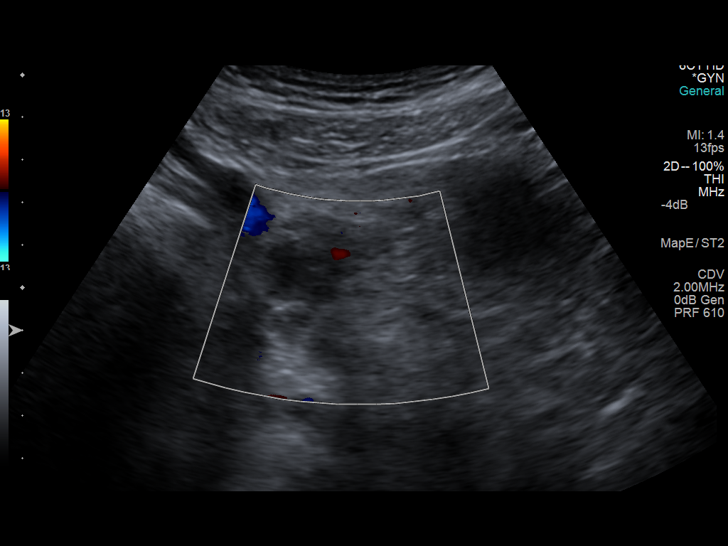
[im 18/71]
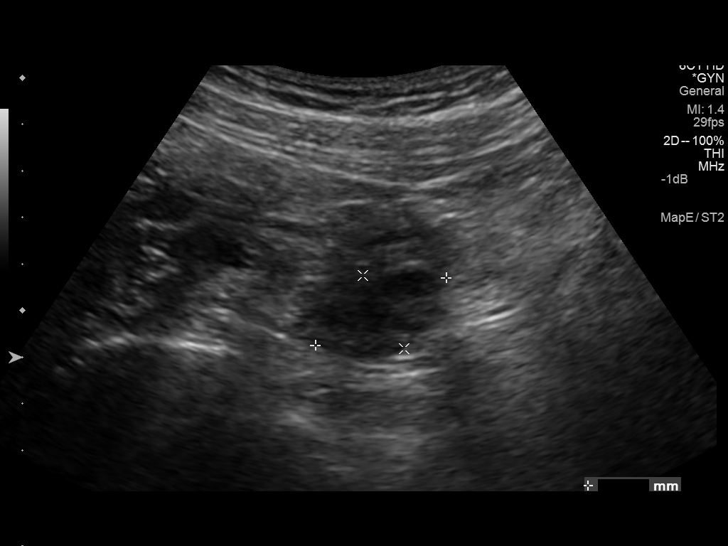
[im 24/71]
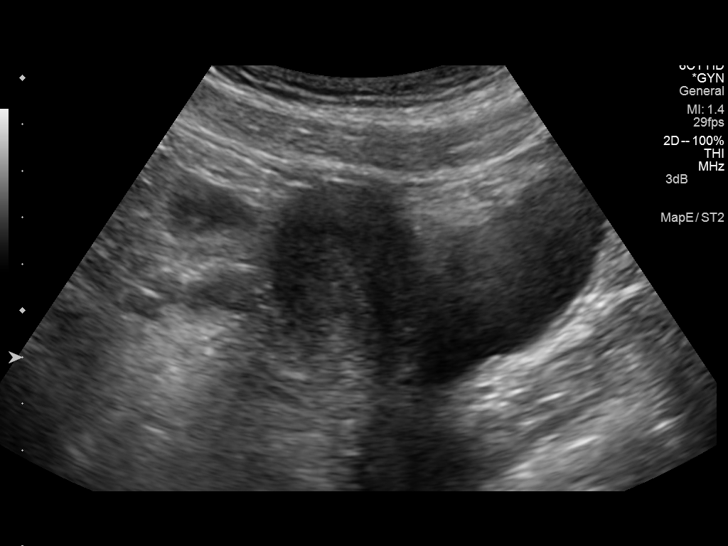
[im 30/71]
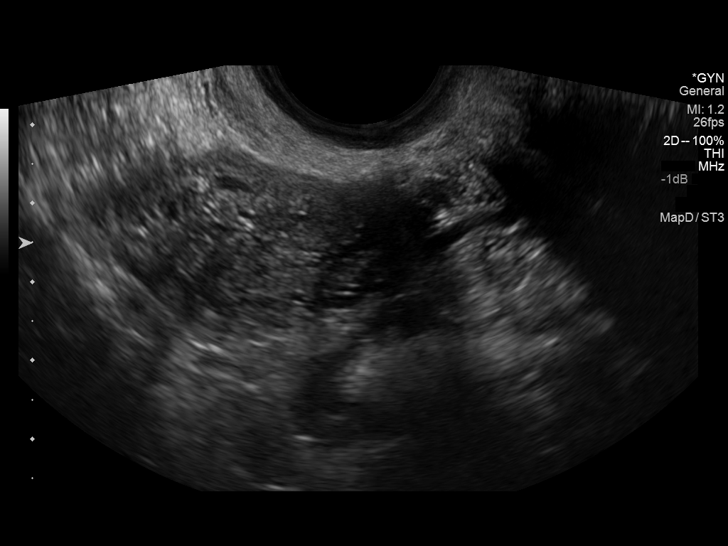
[im 36/71]
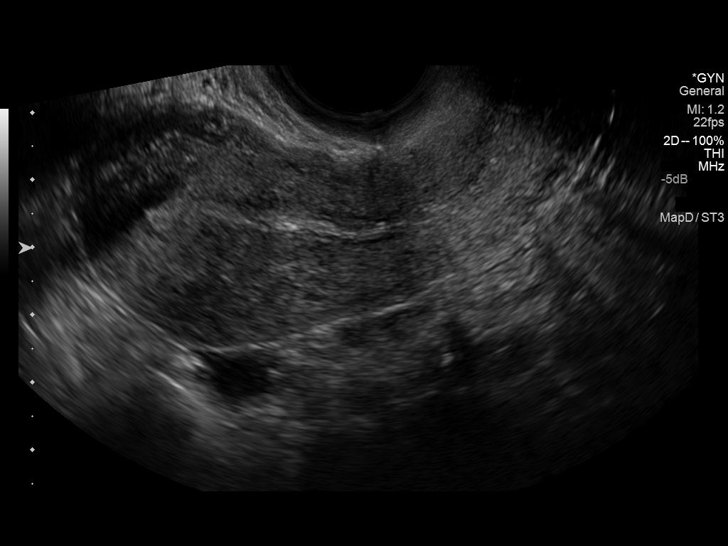
[im 41/71]
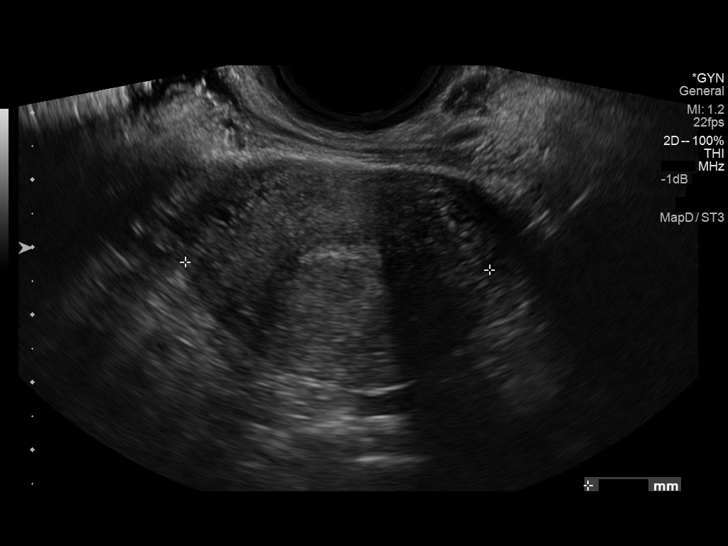
[im 47/71]
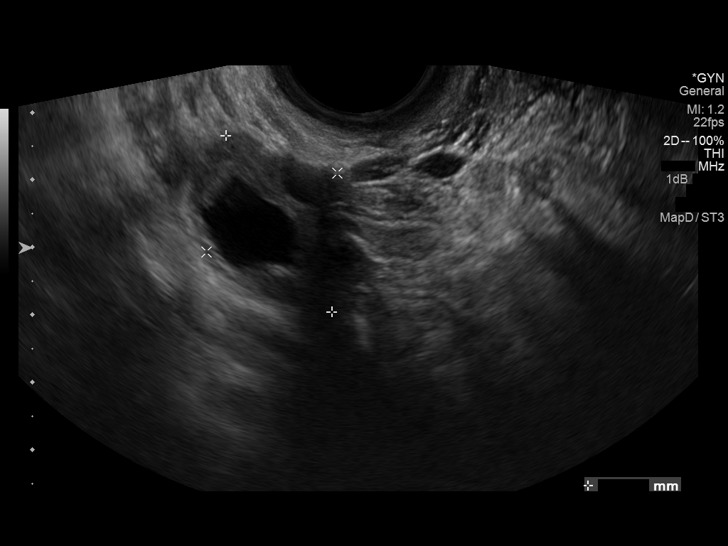
[im 53/71]
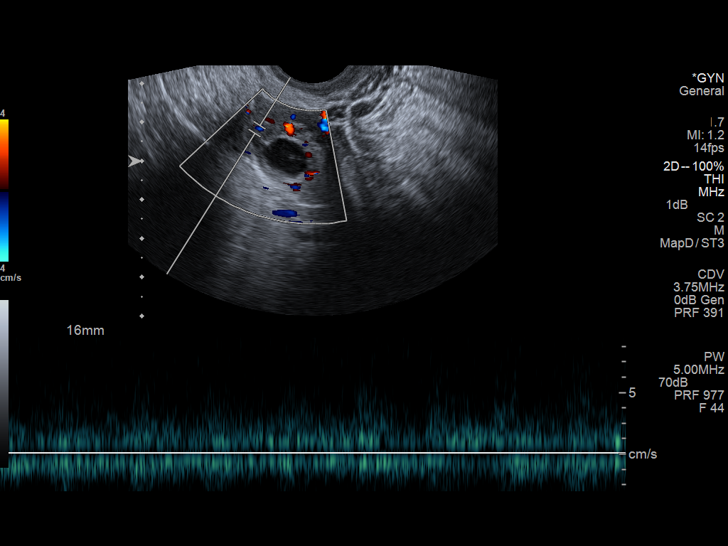
[im 59/71]
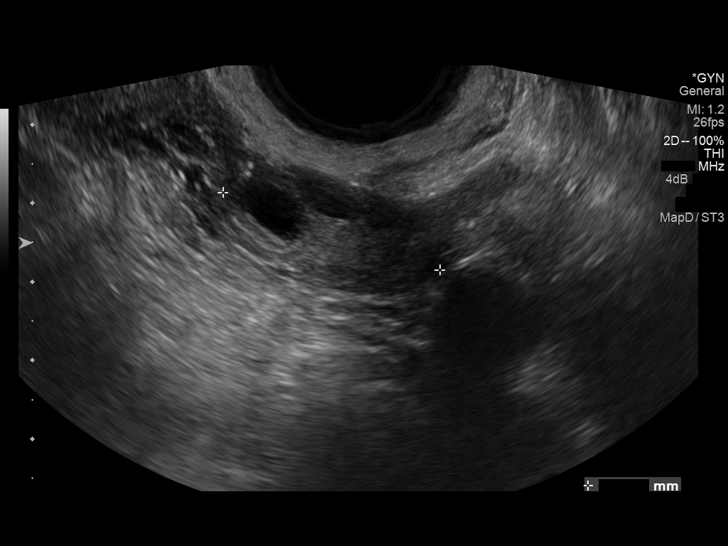
[im 65/71]
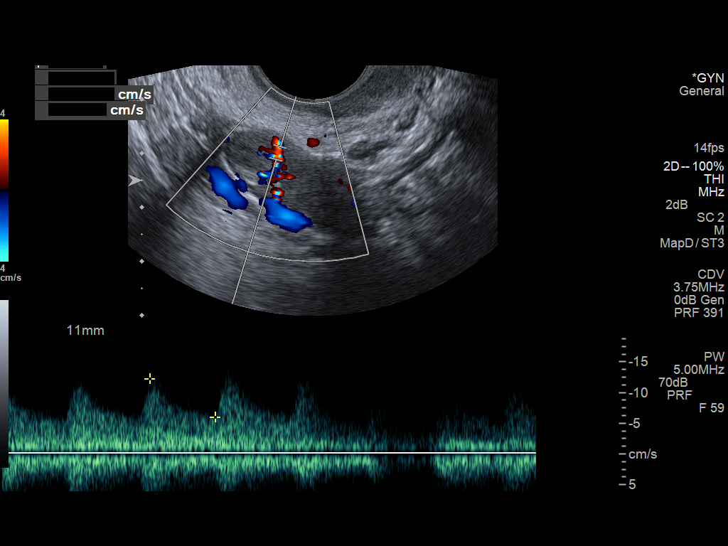
[im 71/71]
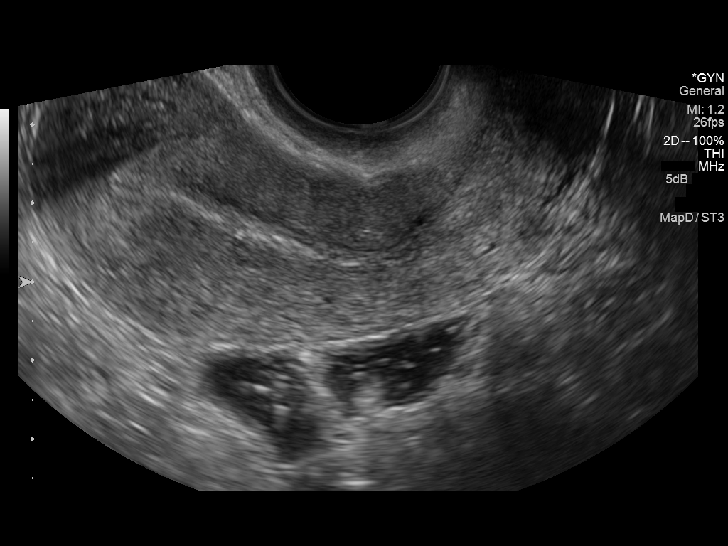

[13 of 25 positions shown; findings below may reference images not displayed]

FINDINGS: Uterus

Measurements: Normal at 8.1 x 3.6 x 4.5 cm. No fibroids or other
mass visualized.

Endometrium

Thickness: Normal thickness at 4.2 mm. No focal abnormality
visualized.

Right ovary

Measurements: Normal in size at 3.1 x 2.3 x 2.4 cm.. No adnexal
mass. Dominant follicle in the right ovary.

Left ovary

Measurements: Normal in size at 2.4 x 1.5 x 2.9 cm. No adnexal mass.

Pulsed Doppler evaluation of both ovaries demonstrates normal
low-resistance arterial and venous waveforms.

Other findings

No free fluid.
IMPRESSION: 1. Normal pelvic ultrasound.
2. No evidence of ovarian torsion.  Normal ovaries.

## 2015-10-11 ENCOUNTER — Inpatient Hospital Stay (HOSPITAL_COMMUNITY)
Admission: AD | Admit: 2015-10-11 | Discharge: 2015-10-11 | Disposition: A | Discharge status: Home / Self Care | Payer: Self-pay | Source: Ambulatory Visit | Attending: Family Medicine | Admitting: Family Medicine

## 2015-10-11 ENCOUNTER — Encounter (HOSPITAL_COMMUNITY): Payer: Self-pay

## 2015-10-11 DIAGNOSIS — F17210 Nicotine dependence, cigarettes, uncomplicated: Secondary | ICD-10-CM | POA: Insufficient documentation

## 2015-10-11 DIAGNOSIS — N83202 Unspecified ovarian cyst, left side: Secondary | ICD-10-CM

## 2015-10-11 LAB — URINALYSIS, ROUTINE W REFLEX MICROSCOPIC
Bilirubin Urine: NEGATIVE
Glucose, UA: NEGATIVE mg/dL
KETONES UR: NEGATIVE mg/dL
LEUKOCYTES UA: NEGATIVE
Nitrite: NEGATIVE
PROTEIN: NEGATIVE mg/dL
Specific Gravity, Urine: >1.03 — ABNORMAL HIGH (ref 1.005–1.030)
pH: 6 (ref 5.0–8.0)

## 2015-10-11 LAB — URINE MICROSCOPIC-ADD ON: WBC UA: NONE SEEN WBC/hpf (ref 0–5)

## 2015-10-11 LAB — POCT PREGNANCY, URINE: Preg Test, Ur: NEGATIVE

## 2015-10-11 MED ORDER — DICLOFENAC SODIUM 75 MG PO TBEC: 75.0000 mg | DELAYED_RELEASE_TABLET | Freq: Two times a day (BID) | ORAL | Status: DC

## 2015-10-11 MED ORDER — OXYCODONE-ACETAMINOPHEN 5-325 MG PO TABS
1.0000 | ORAL_TABLET | Freq: Once | ORAL | Status: AC
  Administered 2015-10-11: 1 via ORAL
  Filled 2015-10-11: qty 1

## 2015-10-11 NOTE — MAU Provider Note (Signed)
History   nulparous with hx of ovarian cyst in with left sided pain that started a couple of days after she started her periods. Her periods are reg and pt states she has hx of ovarian cyst. This pain is similar...  CSN: [id number]  Arrival date & time 10/11/15  1257   None     Chief Complaint  Patient presents with  . Abdominal Pain    HPI  Past Medical History  Diagnosis Date  . Substance abuse in remission   . Anxiety     History reviewed. No pertinent past surgical history.  Family History  Problem Relation Age of Onset  . Diabetes Mother   . Hypertension Mother     Social History  Substance Use Topics  . Smoking status: Current Every Day Smoker -- 0.50 packs/day    Types: Cigarettes  . Smokeless tobacco: None  . Alcohol Use: No    OB History    No data available      Review of Systems  Constitutional: Negative.   HENT: Negative.   Eyes: Negative.   Respiratory: Negative.   Cardiovascular: Negative.   Gastrointestinal: Positive for abdominal pain.  Endocrine: Negative.   Genitourinary: Negative.   Musculoskeletal: Negative.   Skin: Negative.   Allergic/Immunologic: Negative.   Neurological: Negative.   Hematological: Negative.   Psychiatric/Behavioral: Negative.     Allergies  Review of patient's allergies indicates no known allergies.  Home Medications  No current outpatient prescriptions on file.  BP 146/57 mmHg  Pulse 91  Temp(Src) 98.2 F (36.8 C) (Oral)  Resp 18  Ht 5\' 9"  (1.753 m)  Wt 154 lb 9.6 oz (70.126 kg)  BMI 22.82 kg/m2  LMP 10/07/2015  Physical Exam  Constitutional: She is oriented to person, place, and time. She appears well-developed and well-nourished.  HENT:  Head: Normocephalic.  Eyes: Pupils are equal, round, and reactive to light.  Neck: Normal range of motion.  Cardiovascular: Normal rate, regular rhythm, normal heart sounds and intact distal pulses.   Pulmonary/Chest: Effort normal and breath sounds normal.   Abdominal: Soft. Bowel sounds are normal.  Genitourinary: Vagina normal and uterus normal.  Musculoskeletal: Normal range of motion.  Neurological: She is alert and oriented to person, place, and time. She has normal reflexes.  Skin: Skin is warm and dry.  Psychiatric: She has a normal mood and affect. Her behavior is normal. Judgment and thought content normal.    MAU Course  Procedures (including critical care time)  Labs Reviewed  URINALYSIS, ROUTINE W REFLEX MICROSCOPIC (NOT AT [facility]) - Abnormal; Notable for the following:    APPearance HAZY (*)    Specific Gravity, Urine >1.030 (*)    Hgb urine dipstick LARGE (*)    All other components within normal limits  URINE MICROSCOPIC-ADD ON - Abnormal; Notable for the following:    Squamous Epithelial / LPF 6-30 (*)    Bacteria, UA RARE (*)    All other components within normal limits  POCT PREGNANCY, URINE   No results found.   No diagnosis found.    MDM  Left ovarian cyst Manage pain D/c home

## 2015-10-11 NOTE — Discharge Instructions (Signed)
Ovarian Cyst An ovarian cyst is a fluid-filled sac that forms on an ovary. The ovaries are small organs that produce eggs in women. Various types of cysts can form on the ovaries. Most are not cancerous. Many do not cause problems, and they often go away on their own. Some may cause symptoms and require treatment. Common types of ovarian cysts include:  Functional cysts--These cysts may occur every month during the menstrual cycle. This is normal. The cysts usually go away with the next menstrual cycle if the woman does not get pregnant. Usually, there are no symptoms with a functional cyst.  Endometrioma cysts--These cysts form from the tissue that lines the uterus. They are also called "chocolate cysts" because they become filled with blood that turns brown. This type of cyst can cause pain in the lower abdomen during intercourse and with your menstrual period.  Cystadenoma cysts--This type develops from the cells on the outside of the ovary. These cysts can get very big and cause lower abdomen pain and pain with intercourse. This type of cyst can twist on itself, cut off its blood supply, and cause severe pain. It can also easily rupture and cause a lot of pain.  Dermoid cysts--This type of cyst is sometimes found in both ovaries. These cysts may contain different kinds of body tissue, such as skin, teeth, hair, or cartilage. They usually do not cause symptoms unless they get very big.  Theca lutein cysts--These cysts occur when too much of a certain hormone (human chorionic gonadotropin) is produced and overstimulates the ovaries to produce an egg. This is most common after procedures used to assist with the conception of a baby (in vitro fertilization). CAUSES   Fertility drugs can cause a condition in which multiple large cysts are formed on the ovaries. This is called ovarian hyperstimulation syndrome.  A condition called polycystic ovary syndrome can cause hormonal imbalances that can lead to  nonfunctional ovarian cysts. SIGNS AND SYMPTOMS  Many ovarian cysts do not cause symptoms. If symptoms are present, they may include:  Pelvic pain or pressure.  Pain in the lower abdomen.  Pain during sexual intercourse.  Increasing girth (swelling) of the abdomen.  Abnormal menstrual periods.  Increasing pain with menstrual periods.  Stopping having menstrual periods without being pregnant. DIAGNOSIS  These cysts are commonly found during a routine or annual pelvic exam. Tests may be ordered to find out more about the cyst. These tests may include:  Ultrasound.  X-ray of the pelvis.  CT scan.  MRI.  Blood tests. TREATMENT  Many ovarian cysts go away on their own without treatment. Your health care provider may want to check your cyst regularly for 2-3 months to see if it changes. For women in menopause, it is particularly important to monitor a cyst closely because of the higher rate of ovarian cancer in menopausal women. When treatment is needed, it may include any of the following:  A procedure to drain the cyst (aspiration). This may be done using a long needle and ultrasound. It can also be done through a laparoscopic procedure. This involves using a thin, lighted tube with a tiny camera on the end (laparoscope) inserted through a small incision.  Surgery to remove the whole cyst. This may be done using laparoscopic surgery or an open surgery involving a larger incision in the lower abdomen.  Hormone treatment or birth control pills. These methods are sometimes used to help dissolve a cyst. HOME CARE INSTRUCTIONS   Only take over-the-counter   or prescription medicines as directed by your health care provider.  Follow up with your health care provider as directed.  Get regular pelvic exams and Pap tests. SEEK MEDICAL CARE IF:   Your periods are late, irregular, or painful, or they stop.  Your pelvic pain or abdominal pain does not go away.  Your abdomen becomes  larger or swollen.  You have pressure on your bladder or trouble emptying your bladder completely.  You have pain during sexual intercourse.  You have feelings of fullness, pressure, or discomfort in your stomach.  You lose weight for no apparent reason.  You feel generally ill.  You become constipated.  You lose your appetite.  You develop acne.  You have an increase in body and facial hair.  You are gaining weight, without changing your exercise and eating habits.  You think you are pregnant. SEEK IMMEDIATE MEDICAL CARE IF:   You have increasing abdominal pain.  You feel sick to your stomach (nauseous), and you throw up (vomit).  You develop a fever that comes on suddenly.  You have abdominal pain during a bowel movement.  Your menstrual periods become heavier than usual. MAKE SURE YOU:  Understand these instructions.  Will watch your condition.  Will get help right away if you are not doing well or get worse.   This information is not intended to replace advice given to you by your health care provider. Make sure you discuss any questions you have with your health care provider.   Document Released: 09/18/2005 Document Revised: 09/23/2013 Document Reviewed: 05/26/2013 Elsevier Interactive Patient Education 2016 [employer]  

## 2015-10-11 NOTE — MAU Note (Signed)
Period was 4 days late last week, has been very light. Had excruciating LLQ pain Thursday night, pain is still there but is not as severe, has been taking ibuprofen.  Hx of kidney stones, UTI.

## 2015-10-16 ENCOUNTER — Emergency Department (HOSPITAL_COMMUNITY): Payer: BLUE CROSS/BLUE SHIELD

## 2015-10-16 ENCOUNTER — Emergency Department (HOSPITAL_COMMUNITY)
Admission: EM | Admit: 2015-10-16 | Discharge: 2015-10-16 | Disposition: A | Discharge status: Home / Self Care | Payer: BLUE CROSS/BLUE SHIELD | Attending: Emergency Medicine | Admitting: Emergency Medicine

## 2015-10-16 ENCOUNTER — Encounter (HOSPITAL_COMMUNITY): Payer: Self-pay | Admitting: Emergency Medicine

## 2015-10-16 DIAGNOSIS — Z79899 Other long term (current) drug therapy: Secondary | ICD-10-CM | POA: Insufficient documentation

## 2015-10-16 DIAGNOSIS — Z792 Long term (current) use of antibiotics: Secondary | ICD-10-CM | POA: Diagnosis not present

## 2015-10-16 DIAGNOSIS — Z87442 Personal history of urinary calculi: Secondary | ICD-10-CM | POA: Insufficient documentation

## 2015-10-16 DIAGNOSIS — F419 Anxiety disorder, unspecified: Secondary | ICD-10-CM | POA: Insufficient documentation

## 2015-10-16 DIAGNOSIS — R1032 Left lower quadrant pain: Secondary | ICD-10-CM

## 2015-10-16 DIAGNOSIS — F17210 Nicotine dependence, cigarettes, uncomplicated: Secondary | ICD-10-CM | POA: Insufficient documentation

## 2015-10-16 DIAGNOSIS — Z3202 Encounter for pregnancy test, result negative: Secondary | ICD-10-CM | POA: Insufficient documentation

## 2015-10-16 DIAGNOSIS — Z791 Long term (current) use of non-steroidal anti-inflammatories (NSAID): Secondary | ICD-10-CM | POA: Insufficient documentation

## 2015-10-16 DIAGNOSIS — K5900 Constipation, unspecified: Secondary | ICD-10-CM

## 2015-10-16 LAB — CBC
HCT: 39.4 % (ref 36.0–46.0)
HEMOGLOBIN: 13.1 g/dL (ref 12.0–15.0)
MCH: 30.5 pg (ref 26.0–34.0)
MCHC: 33.2 g/dL (ref 30.0–36.0)
MCV: 91.6 fL (ref 78.0–100.0)
Platelets: 280 10*3/uL (ref 150–400)
RBC: 4.3 MIL/uL (ref 3.87–5.11)
RDW: 13.1 % (ref 11.5–15.5)
WBC: 10.9 10*3/uL — ABNORMAL HIGH (ref 4.0–10.5)

## 2015-10-16 LAB — COMPREHENSIVE METABOLIC PANEL
ALBUMIN: 4.6 g/dL (ref 3.5–5.0)
ALK PHOS: 44 U/L (ref 38–126)
ALT: 10 U/L — ABNORMAL LOW (ref 14–54)
ANION GAP: 9 (ref 5–15)
AST: 13 U/L — ABNORMAL LOW (ref 15–41)
BILIRUBIN TOTAL: 1 mg/dL (ref 0.3–1.2)
BUN: 21 mg/dL — ABNORMAL HIGH (ref 6–20)
CALCIUM: 9.4 mg/dL (ref 8.9–10.3)
CO2: 27 mmol/L (ref 22–32)
Chloride: 101 mmol/L (ref 101–111)
Creatinine, Ser: 0.85 mg/dL (ref 0.44–1.00)
GFR calc non Af Amer: >60 mL/min (ref >60)
Glucose, Bld: 84 mg/dL (ref 65–99)
Potassium: 4 mmol/L (ref 3.5–5.1)
SODIUM: 137 mmol/L (ref 135–145)
TOTAL PROTEIN: 7.2 g/dL (ref 6.5–8.1)

## 2015-10-16 LAB — URINALYSIS, ROUTINE W REFLEX MICROSCOPIC
Bilirubin Urine: NEGATIVE
Glucose, UA: NEGATIVE mg/dL
Hgb urine dipstick: NEGATIVE
Ketones, ur: NEGATIVE mg/dL
NITRITE: NEGATIVE
Protein, ur: NEGATIVE mg/dL
SPECIFIC GRAVITY, URINE: 1.034 — AB (ref 1.005–1.030)
pH: 6 (ref 5.0–8.0)

## 2015-10-16 LAB — URINE MICROSCOPIC-ADD ON: WBC UA: NONE SEEN WBC/hpf (ref 0–5)

## 2015-10-16 LAB — LIPASE, BLOOD: LIPASE: 18 U/L (ref 11–51)

## 2015-10-16 LAB — PREGNANCY, URINE: Preg Test, Ur: NEGATIVE

## 2015-10-16 MED ORDER — OXYCODONE-ACETAMINOPHEN 5-325 MG PO TABS: ORAL_TABLET | ORAL | Status: DC

## 2015-10-16 MED ORDER — MORPHINE SULFATE (PF) 4 MG/ML IV SOLN
4.0000 mg | Freq: Once | INTRAVENOUS | Status: AC
  Administered 2015-10-16: 4 mg via INTRAVENOUS
  Filled 2015-10-16: qty 1

## 2015-10-16 MED ORDER — ONDANSETRON HCL 4 MG/2ML IJ SOLN
4.0000 mg | Freq: Once | INTRAMUSCULAR | Status: AC
  Administered 2015-10-16: 4 mg via INTRAVENOUS
  Filled 2015-10-16: qty 2

## 2015-10-16 MED ORDER — SENNOSIDES-DOCUSATE SODIUM 8.6-50 MG PO TABS: 1.0000 | ORAL_TABLET | Freq: Every day | ORAL | Status: DC

## 2015-10-16 MED ORDER — HYDROMORPHONE HCL 1 MG/ML IJ SOLN
0.5000 mg | Freq: Once | INTRAMUSCULAR | Status: AC
  Administered 2015-10-16: 0.5 mg via INTRAVENOUS
  Filled 2015-10-16: qty 1

## 2015-10-16 NOTE — ED Notes (Signed)
Ultrasound at bedside

## 2015-10-16 NOTE — ED Notes (Signed)
Patient reports LLQ abdominal pain radiating to left flank x1 week. C/o urinary frequency. Denies N/V/D, fever, chills.

## 2015-10-16 NOTE — ED Provider Notes (Signed)
CSN: [id number]     Arrival date & time 10/16/15  0035 History   First MD Initiated Contact with Patient 10/16/15 0102     Chief Complaint  Patient presents with  . Abdominal Pain     (Consider location/radiation/quality/duration/timing/severity/associated sxs/prior Treatment) HPI  Blood pressure 130/81, pulse 97, temperature 97.7 F (36.5 C), temperature source Oral, resp. rate 18, last menstrual period 10/07/2015, SpO2 97 %.  Stacey BolognaBrittany Whittemore is a 30 y.o. female complaining of severe, 10 out of 10 left lower quadrant abdominal pain radiating to the left flank worsening over the course of a week. She denies fever, chills, nausea, vomiting, dysuria, hematuria, abnormal vaginal discharge. Has history of kidney stones. Patient was seen by OB/GYN and had pelvic exam and ultrasound which was unremarkable, patient had abnormal Pap smear, is scheduled for biopsy next week. She is in recovery and not taking any pain medication but states that the pain is severe and is requesting stronger pain medication, states she is distracted by it.    Past Medical History  Diagnosis Date  . Substance abuse in remission   . Anxiety    History reviewed. No pertinent past surgical history. Family History  Problem Relation Age of Onset  . Diabetes Mother   . Hypertension Mother    Social History  Substance Use Topics  . Smoking status: Current Every Day Smoker -- 0.50 packs/day    Types: Cigarettes  . Smokeless tobacco: None  . Alcohol Use: No   OB History    No data available     Review of Systems  10 systems reviewed and found to be negative, except as noted in the HPI.   Allergies  Review of patient's allergies indicates no known allergies.  Home Medications   Prior to Admission medications   Medication Sig Start Date End Date Taking? Authorizing Provider  clonazePAM (KLONOPIN) 1 MG tablet Take 1 mg by mouth 2 (two) times daily.   Yes Historical Provider, MD  diclofenac (VOLTAREN)  75 MG EC tablet Take 1 tablet (75 mg total) by mouth 2 (two) times daily. 10/11/15  Yes Montez MoritaMarie D Lawson, CNM  ibuprofen (ADVIL,MOTRIN) 200 MG tablet Take 800 mg by mouth every 6 (six) hours as needed for moderate pain.   Yes Historical Provider, MD  metroNIDAZOLE (FLAGYL) 500 MG tablet Take 500 mg by mouth 2 (two) times daily.  10/13/15 10/20/15 Yes Historical Provider, MD  Multiple Vitamins-Minerals (HAIR/SKIN/NAILS PO) Take 1 tablet by mouth daily.   Yes Historical Provider, MD  phentermine 37.5 MG capsule Take 37.5 mg by mouth every morning.   Yes Historical Provider, MD  oxyCODONE-acetaminophen (PERCOCET/ROXICET) 5-325 MG tablet 1 to 2 tabs PO q6hrs  PRN for pain 10/16/15   Joni ReiningNicole Metzli Pollick, PA-C  senna-docusate (SENOKOT-S) 8.6-50 MG tablet Take 1 tablet by mouth daily. 10/16/15   Dyani Babel, PA-C   BP 106/64 mmHg  Pulse 79  Temp(Src) 97.7 F (36.5 C) (Oral)  Resp 19  SpO2 100%  LMP 10/07/2015 Physical Exam  Constitutional: She is oriented to person, place, and time. She appears well-developed and well-nourished. No distress.  HENT:  Head: Normocephalic.  Mouth/Throat: Oropharynx is clear and moist.  Eyes: Conjunctivae and EOM are normal.  Cardiovascular: Normal rate and regular rhythm.   Pulmonary/Chest: Effort normal and breath sounds normal. No stridor.  Abdominal: Soft. Bowel sounds are normal. She exhibits no distension. There is tenderness.  Mild tenderness to palpation of left lower quadrant with no guarding or rebound.  Mild left  CVA tenderness to percussion  Musculoskeletal: Normal range of motion.  Neurological: She is alert and oriented to person, place, and time.  Psychiatric:  Anxious  Nursing note and vitals reviewed.   ED Course  Procedures (including critical care time) Labs Review Labs Reviewed  COMPREHENSIVE METABOLIC PANEL - Abnormal; Notable for the following:    BUN 21 (*)    AST 13 (*)    ALT 10 (*)    All other components within normal limits  CBC  - Abnormal; Notable for the following:    WBC 10.9 (*)    All other components within normal limits  URINALYSIS, ROUTINE W REFLEX MICROSCOPIC (NOT AT [facility]) - Abnormal; Notable for the following:    Color, Urine AMBER (*)    APPearance CLOUDY (*)    Specific Gravity, Urine 1.034 (*)    Leukocytes, UA SMALL (*)    All other components within normal limits  URINE MICROSCOPIC-ADD ON - Abnormal; Notable for the following:    Squamous Epithelial / LPF 6-30 (*)    Bacteria, UA RARE (*)    Crystals CA OXALATE CRYSTALS (*)    All other components within normal limits  URINE CULTURE  LIPASE, BLOOD  PREGNANCY, URINE    Imaging Review US Transvaginal Non-ob  10/16/2015  CLINICAL DATA:  LEFT lower quadrant pain. History of LEFT nephrolithiasis. EXAM: TRANSABDOMINAL AND TRANSVAGINAL ULTRASOUND OF PELVIS DOPPLER ULTRASOUND OF OVARIES TECHNIQUE: Both transabdominal and transvaginal ultrasound examinations of the pelvis were performed. Transabdominal technique was performed for global imaging of the pelvis including uterus, ovaries, adnexal regions, and pelvic cul-de-sac. It was necessary to proceed with endovaginal exam following the transabdominal exam to visualize the ovaries and endometrium. Color and duplex Doppler ultrasound was utilized to evaluate blood flow to the ovaries. COMPARISON:  CT abdomen and pelvis August 06, 2014 FINDINGS: Uterus Measurements: 7.7 x 3.6 x 5.1 cm. No fibroids or other mass visualized. Endometrium Thickness: 6 mm.  No focal abnormality visualized. Right ovary Measurements: 3.4 x 1.6 x 2 cm. Normal appearance/no adnexal mass. Left ovary Measurements: 3.9 x 2.8 x 2.7 cm. Normal appearance/no adnexal mass. 1.8 cm dominant anechoic follicle. Pulsed Doppler evaluation of both ovaries demonstrates normal low-resistance arterial and venous waveforms. Other findings No abnormal free fluid. Echogenic possible debris within the urinary bladder. IMPRESSION: Possible debris within the  urinary bladder, recommend correlation with urinary analysis. Otherwise normal pelvic ultrasound. Electronically Signed   By: Awilda Metro M.D.   On: 10/16/2015 03:03   US Pelvis Complete  10/16/2015  CLINICAL DATA:  LEFT lower quadrant pain. History of LEFT nephrolithiasis. EXAM: TRANSABDOMINAL AND TRANSVAGINAL ULTRASOUND OF PELVIS DOPPLER ULTRASOUND OF OVARIES TECHNIQUE: Both transabdominal and transvaginal ultrasound examinations of the pelvis were performed. Transabdominal technique was performed for global imaging of the pelvis including uterus, ovaries, adnexal regions, and pelvic cul-de-sac. It was necessary to proceed with endovaginal exam following the transabdominal exam to visualize the ovaries and endometrium. Color and duplex Doppler ultrasound was utilized to evaluate blood flow to the ovaries. COMPARISON:  CT abdomen and pelvis August 06, 2014 FINDINGS: Uterus Measurements: 7.7 x 3.6 x 5.1 cm. No fibroids or other mass visualized. Endometrium Thickness: 6 mm.  No focal abnormality visualized. Right ovary Measurements: 3.4 x 1.6 x 2 cm. Normal appearance/no adnexal mass. Left ovary Measurements: 3.9 x 2.8 x 2.7 cm. Normal appearance/no adnexal mass. 1.8 cm dominant anechoic follicle. Pulsed Doppler evaluation of both ovaries demonstrates normal low-resistance arterial and venous waveforms. Other findings No abnormal  free fluid. Echogenic possible debris within the urinary bladder. IMPRESSION: Possible debris within the urinary bladder, recommend correlation with urinary analysis. Otherwise normal pelvic ultrasound. Electronically Signed   By: Awilda Metro M.D.   On: 10/16/2015 03:03   [place] Art/ven Flow Abd Pelv Doppler  10/16/2015  CLINICAL DATA:  LEFT lower quadrant pain. History of LEFT nephrolithiasis. EXAM: TRANSABDOMINAL AND TRANSVAGINAL ULTRASOUND OF PELVIS DOPPLER ULTRASOUND OF OVARIES TECHNIQUE: Both transabdominal and transvaginal ultrasound examinations of the pelvis were  performed. Transabdominal technique was performed for global imaging of the pelvis including uterus, ovaries, adnexal regions, and pelvic cul-de-sac. It was necessary to proceed with endovaginal exam following the transabdominal exam to visualize the ovaries and endometrium. Color and duplex Doppler ultrasound was utilized to evaluate blood flow to the ovaries. COMPARISON:  CT abdomen and pelvis August 06, 2014 FINDINGS: Uterus Measurements: 7.7 x 3.6 x 5.1 cm. No fibroids or other mass visualized. Endometrium Thickness: 6 mm.  No focal abnormality visualized. Right ovary Measurements: 3.4 x 1.6 x 2 cm. Normal appearance/no adnexal mass. Left ovary Measurements: 3.9 x 2.8 x 2.7 cm. Normal appearance/no adnexal mass. 1.8 cm dominant anechoic follicle. Pulsed Doppler evaluation of both ovaries demonstrates normal low-resistance arterial and venous waveforms. Other findings No abnormal free fluid. Echogenic possible debris within the urinary bladder. IMPRESSION: Possible debris within the urinary bladder, recommend correlation with urinary analysis. Otherwise normal pelvic ultrasound. Electronically Signed   By: Awilda Metro M.D.   On: 10/16/2015 03:03   Ct Renal Stone Study  10/16/2015  CLINICAL DATA:  LEFT lower quadrant pain radiating to LEFT flank for 1 week, urinary frequency. EXAM: CT ABDOMEN AND PELVIS WITHOUT CONTRAST TECHNIQUE: Multidetector CT imaging of the abdomen and pelvis was performed following the standard protocol without IV contrast. COMPARISON:  Pelvic ultrasound October 16, 2015 at 0208 hours and CT abdomen and pelvis November 5th 2015 FINDINGS: LUNG BASES: Included view of the lung bases are clear. The visualized heart and pericardium are unremarkable. KIDNEYS/BLADDER: Kidneys are orthotopic, demonstrating normal size and morphology. No nephrolithiasis, hydronephrosis; limited assessment for renal masses on this nonenhanced examination. The unopacified ureters are normal in course and  caliber. Urinary bladder is partially distended and unremarkable. SOLID ORGANS: The liver, spleen, gallbladder, pancreas and adrenal glands are unremarkable for this non-contrast examination. GASTROINTESTINAL TRACT: The stomach, small and large bowel are normal in course and caliber without inflammatory changes, the sensitivity may be decreased by lack of enteric contrast. Moderate amount of retained large bowel stool. Normal appendix. PERITONEUM/RETROPERITONEUM: Aortoiliac vessels are normal in course and caliber. No lymphadenopathy by CT size criteria. Internal reproductive organs are unremarkable. No intraperitoneal free fluid nor free air. Phleboliths in the pelvis. SOFT TISSUES/ OSSEOUS STRUCTURES: Nonsuspicious. Small fat containing umbilical hernia. IMPRESSION: No urolithiasis, obstructive uropathy nor acute intra-abdominal/pelvic process. Moderate amount of retained large bowel stool without bowel obstruction. Electronically Signed   By: Awilda Metro M.D.   On: 10/16/2015 05:15   I have personally reviewed and evaluated these images and lab results as part of my medical decision-making.   EKG Interpretation None      MDM   Final diagnoses:  LLQ pain  Constipation, unspecified constipation type    Filed Vitals:   10/16/15 0057 10/16/15 0314  BP: 130/81 106/64  Pulse: 97 79  Temp: 97.7 F (36.5 C) 97.7 F (36.5 C)  TempSrc: Oral Oral  Resp: 18 19  SpO2: 97% 100%    Medications  morphine 4 MG/ML injection 4 mg (4  mg Intravenous Given 10/16/15 0212)  ondansetron (ZOFRAN) injection 4 mg (4 mg Intravenous Given 10/16/15 1610)  HYDROmorphone (DILAUDID) injection 0.5 mg (0.5 mg Intravenous Given 10/16/15 0333)    Stacey Ayala is 30 y.o. female presenting with left lower quadrant pain worsening over the course of a week, no other GI symptoms. Patient recently seen OB/GYN, had ultrasound which was noncontributory, abnormal Pap smear, she is pending biopsy next week. Abdominal  exam is nonsurgical, she has no real urinary symptoms, has history of kidney stones. Blood work reassuring, urinalysis is not consistent with infection. Patient has history of IV drug use, we've had an extensive discussion on pain medications and she understands the risks of narcotics, patient FOR aggressive pain management.  Ultrasound with normal ovarian flow, no ovarian cysts. They noted debris within the urinary bladder. Patient requesting more pain medication, will switch her to Dilaudid. Obtain CT abdomen pelvis to evaluate for stones.  Stone protocol CT negative. Urinalysis with small leukocytes, no white blood cells and rare bacteria, I doubt this is a UTI. Urine culture pending.  Evaluation does not show pathology that would require ongoing emergent intervention or inpatient treatment. Pt is hemodynamically stable and mentating appropriately. Discussed findings and plan with patient/guardian, who agrees with care plan. All questions answered. Return precautions discussed and outpatient follow up given.   New Prescriptions   OXYCODONE-ACETAMINOPHEN (PERCOCET/ROXICET) 5-325 MG TABLET    1 to 2 tabs PO q6hrs  PRN for pain   SENNA-DOCUSATE (SENOKOT-S) 8.6-50 MG TABLET    Take 1 tablet by mouth daily.       Wynetta Emery, PA-C 10/16/15 0550  Lyndal Pulley, MD 10/16/15 [phone]

## 2015-10-16 NOTE — Discharge Instructions (Signed)
Do not hesitate to return to the emergency room for any new, worsening or concerning symptoms. ° °Please obtain primary care using resource guide below. Let them know that you were seen in the emergency room and that they will need to obtain records for further outpatient management. ° ° ° °Emergency Department Resource Guide °1) Find a Doctor and Pay Out of Pocket °Although you won't have to find out who is covered by your insurance plan, it is a good idea to ask around and get recommendations. You will then need to call the office and see if the doctor you have chosen will accept you as a new patient and what types of options they offer for patients who are self-pay. Some doctors offer discounts or will set up payment plans for their patients who do not have insurance, but you will need to ask so you aren't surprised when you get to your appointment. ° °2) Contact Your Local Health Department °Not all health departments have doctors that can see patients for sick visits, but many do, so it is worth a call to see if yours does. If you don't know where your local health department is, you can check in your phone book. The CDC also has a tool to help you locate your state's health department, and many state websites also have listings of all of their local health departments. ° °3) Find a Walk-in Clinic °If your illness is not likely to be very severe or complicated, you may want to try a walk in clinic. These are popping up all over the country in pharmacies, drugstores, and shopping centers. They're usually staffed by nurse practitioners or physician assistants that have been trained to treat common illnesses and complaints. They're usually fairly quick and inexpensive. However, if you have serious medical issues or chronic medical problems, these are probably not your best option. ° °No Primary Care Doctor: °- Call [facility] at  832-8000 - they can help you locate a primary care doctor that  accepts your  insurance, provides certain services, etc. °- Physician Referral Service- 1-800-533-3463 ° °Chronic Pain Problems: °Organization         Address  Phone   Notes  °[facility]  (336) 297-2271 Patients need to be referred by their primary care doctor.  ° °Medication Assistance: °Organization         Address  Phone   Notes  °[place] [facility] [address] °[place], [place] [address] (336) 641-8030 --Must be a resident of [place] °-- Must have NO insurance coverage whatsoever (no Medicaid/ Medicare, etc.) °-- The pt. MUST have a primary care doctor that directs their care regularly and follows them in the community °  °[facility]  (866) 331-1348   °[employer]  (888) 892-1162   ° °Agencies that provide inexpensive medical care: °Organization         Address  Phone   Notes  °[facility]  (336) 832-8035   °[facility]    (336) 832-7272   °[facility] [address] °[place],  [address] (336) 832-4777   °[facility] [address], °[place] (336) 271-4999   °[facility]    (336) 373-0678   °[facility]    (336) 272-1050   °[facility] ° [address], [place] Phone:  (336) 832-4444, Fax:  (336) 832-4440 Hours of Operation:  9 am -   6 pm, M-F.  Also accepts Medicaid/Medicare and self-pay.  °[facility] ° [address], [place] Phone: (336) 832-3150, Fax: (336) 832-3151. Hours of Operation:  8:30 am - 5:30 pm, M-F.  Also accepts Medicaid and self-pay.  °[facility] [place] [address], [place] Phone: (336) 878-6027   °[facility] [address], [place], [place] (336)723-1848, Ext. 123 Mondays & Thursdays: 7-9 AM.  First 15 patients are seen on a first come, first serve basis. °  ° °Medicaid-accepting [place] Providers: ° °Organization          Address  Phone   Notes  °[facility] [address], [place] (336) 641-2100 Also accepts self-pay patients.  °[facility] [address], [place] ° (336) 856-9996   °[facility] [address] [place] [address], [place] (336) 288-8857   °[facility] [address] [place] [address], [place] (336) 299-7000   °Veita Bland [address], [place]  ° (336) 373-1557 Only accepts [place] Access Medicaid patients after they have their name applied to their card.  ° °Self-Pay (no insurance) in [place]: ° °Organization         Address  Phone   Notes  °Sickle Cell Patients, [facility] [address], [place] (336) 832-1970   °[facility] [address], [place] (336) 832-4400   °[facility] ° [address],  (336) 992-4800   °[facility]/Dr. Osei-Bonsu ° [address] [place] [address], [place] or [address], [place] (336) 841-8500 Phone number for both [place] and [place] locations is the same.  °[facility] [address], [place] (336) 299-0000   °[facility] [address] [place] [address], [place] or [address] (336) 852-7530 °(336) 878-2260   °[facility] [address], [place] (336) 350-1642, phone; (336) 294-5005, fax Sees patients 1st and 3rd Saturday of every month.  Must not qualify for public or private insurance (i.e. Medicaid, Medicare, [place] Health Choice, Veterans' Benefits) • Household income should be no more than 200% of the poverty level •The clinic cannot treat you if you are pregnant or think you are pregnant • Sexually transmitted diseases are not treated at the clinic.  ° ° °Dental Care: °Organization         Address  Phone  Notes  °[facility] [facility] [address],  [place] (336) 641-6152 Accepts children up to age 21 who are enrolled in Medicaid or [place] Health Choice; pregnant women with a Medicaid card; and children who have applied for Medicaid or [place] Health Choice, but were declined, whose parents can pay a reduced fee at time of service.  °[facility] [place]  [address], [place] (336) 641-7733 Accepts children up to age 21 who are enrolled in Medicaid or [place] Health Choice; pregnant women with a Medicaid card; and children who have applied for Medicaid or  Health Choice, but were declined, whose parents can pay a reduced fee at time of service.  °[facility] ° [address], [place] (336) 641-4533 Patients are seen by appointment only. Walk-ins are not accepted. [facility] will see patients 18 years of age and   older. °Monday - Tuesday (8am-5pm) °Most Wednesdays (8:30-5pm) °$30 per visit, cash only  °[facility] ° [address], [place] (336) 641-4533 Patients are seen by appointment only. Walk-ins are not accepted. [facility] will see patients 18 years of age and older. °One Wednesday Evening (Monthly: Volunteer Based).  $30 per visit, cash only  °[facility]  (919) 537-3737 for adults; Children under age 4, call Graduate Pediatric Dentistry at (919) [phone]. Children aged 4-14, please call (919) 537-3737 to request a pediatric application. ° Dental services are provided in all areas of dental care including fillings, crowns and bridges, complete and partial dentures, implants, gum treatment, root canals, and extractions. Preventive care is also provided. Treatment is provided to both adults and children. °Patients are selected via a lottery and there is often a waiting list. °  °[facility] [address], °[place] ° (336) 763-8833 [email or web address] °  °[facility] [address], [place], [place]  (336)723-1848, Ext. 123 Second and Fourth Thursday of each month, opens at 6:30 AM; Clinic ends at 9 AM.  Patients are seen on a first-come first-served basis, and a limited number are seen during each clinic.  ° °[facility] ° [address], [place], [place] (336) 723-7904   Eligibility Requirements °You must have lived in [place], [place], or [place] counties for at least the last three months. °  You cannot be eligible for state or federal sponsored healthcare insurance, including Veterans Administration, Medicaid, or Medicare. °  You generally cannot be eligible for healthcare insurance through your employer.  °  How to apply: °Eligibility screenings are held every Tuesday and Wednesday afternoon from 1:00 pm until 4:00 pm. You do not need an appointment for the interview!  °[facility] [address], [place], [place] 336-631-2330   °[facility]  336-342-8273   °[facility]  336-703-3100   °[facility]  336-570-6415   ° °Behavioral Health Resources in the Community: °Intensive Outpatient Programs °Organization         Address  Phone  Notes  °[facility] [address], [place], [place] 336-878-6098   °[facility] [address], [place], [place] 336-832-9800   °[facility] [address], [place], [place] ° 336-882-2125   °[facility] [address],  °[place], [place] 1-800-853-5163 or 336-641-4981   °Substance Abuse Resources °Organization         Address  Phone  Notes  °[facility]  336-882-2125   °[facility]  336-784-9470   °[facility]  336-285-9073   °[facility]  336-845-3988   °Residential & Outpatient Substance Abuse Program  1-800-659-3381   °Psychological Services °Organization         Address  Phone  Notes  °[facility]  336- 832-9600   °[facility]  336- 378-7881    °[facility] [address], [place] 1-800-853-5163 or 336-641-4981   ° °Mobile Crisis Teams °Organization         Address  Phone  Notes  °[facility], Mobile Crisis Care Unit  1-877-626-1772   °Assertive °[facility] ° [address] [place], [place] 336-834-9664   °Sharon DeEsch [address] °[place] 336-554-5454   ° °Self-Help/Support Groups °Organization         Address    Phone             Notes  °[facility] [place] - variety of support groups  336- 373-1402 Call for more information  °Narcotics Anonymous (NA), [facility] [address], °[place]  2 meetings at this location  ° °Residential Treatment Programs °Organization         Address  Phone  Notes  °[facility] [address],    °[place]  1-866-801-8205   °[facility] ° [address], [place], [place] 704-293-8524   °[facility] [address], [place] 336-845-3988 Admissions: 8am-3pm M-F  °[facility] [address]    °[place], [place] 336-841-1104   °[facility] [address], [place], [place] 336-379-7146   °[facility] [address]  °[place], [place] 336-285-9073   °[facility] Intensive Outpatient [address], [place], [place] 336-852-3033   °[facility] [address]  °[place], [place] 1-877-615-2722 or 336-784-9470   °[facility] [address] [place], [place] 336-227-7417 Accepts Medicaid  °[facility] [address]  °[place] 1-800-659-3381 Substance Abuse/Addiction Treatment  ° °[place] Behavioral Health Resources °Organization         Address  Phone  Notes  °[facility]  (888) 581-9988   °Julie Brannon, PhD [address] [place], [place]   (336) 349-5553 or (336) 951-0000   °[facility]   [address] °[place], [place] (336) 349-4454   °[facility] [address], [place], [place] (336) 342-8316 Insurance/Medicaid/sponsorship through [facility]  °[facility] [address]                                    [place], [place] (336) 342-8316 Therapy/tele-psych/case  °[facility] [address]  ° [place],  (336) 349-2233    °Dr. Arfeen  (336) 349-4544   °[facility] [place]  [employer] [place] [facility] 1) [address], [place] °2) [address], [place] °3)  [address], [place] (336) 349-3220 °(336) 342-7768 ° °(336) 342-8140   °[place] [facility] (336) 342-1394 or (336) 342-3537 (After Hours)    ° ° ° °

## 2015-10-18 LAB — URINE CULTURE: CULTURE: NO GROWTH

## 2015-12-25 ENCOUNTER — Inpatient Hospital Stay (HOSPITAL_COMMUNITY): Payer: BLUE CROSS/BLUE SHIELD

## 2015-12-25 ENCOUNTER — Inpatient Hospital Stay (HOSPITAL_COMMUNITY)
Admission: AD | Admit: 2015-12-25 | Discharge: 2015-12-25 | Discharge status: Against Medical Advice | Payer: BLUE CROSS/BLUE SHIELD | Source: Ambulatory Visit | Attending: Obstetrics & Gynecology | Admitting: Obstetrics & Gynecology

## 2015-12-25 ENCOUNTER — Encounter (HOSPITAL_COMMUNITY): Payer: Self-pay | Admitting: *Deleted

## 2015-12-25 DIAGNOSIS — F419 Anxiety disorder, unspecified: Secondary | ICD-10-CM | POA: Insufficient documentation

## 2015-12-25 DIAGNOSIS — F17210 Nicotine dependence, cigarettes, uncomplicated: Secondary | ICD-10-CM | POA: Insufficient documentation

## 2015-12-25 DIAGNOSIS — R109 Unspecified abdominal pain: Secondary | ICD-10-CM | POA: Diagnosis not present

## 2015-12-25 DIAGNOSIS — Z5321 Procedure and treatment not carried out due to patient leaving prior to being seen by health care provider: Secondary | ICD-10-CM | POA: Insufficient documentation

## 2015-12-25 DIAGNOSIS — R1032 Left lower quadrant pain: Secondary | ICD-10-CM | POA: Diagnosis present

## 2015-12-25 LAB — URINE MICROSCOPIC-ADD ON

## 2015-12-25 LAB — URINALYSIS, ROUTINE W REFLEX MICROSCOPIC
Bilirubin Urine: NEGATIVE
GLUCOSE, UA: NEGATIVE mg/dL
Ketones, ur: NEGATIVE mg/dL
LEUKOCYTES UA: NEGATIVE
NITRITE: NEGATIVE
PROTEIN: NEGATIVE mg/dL
Specific Gravity, Urine: >1.03 — ABNORMAL HIGH (ref 1.005–1.030)
pH: 5.5 (ref 5.0–8.0)

## 2015-12-25 LAB — POCT PREGNANCY, URINE: PREG TEST UR: NEGATIVE

## 2015-12-25 MED ORDER — KETOROLAC TROMETHAMINE 60 MG/2ML IM SOLN
60.0000 mg | Freq: Once | INTRAMUSCULAR | Status: AC
  Administered 2015-12-25: 60 mg via INTRAMUSCULAR
  Filled 2015-12-25: qty 2

## 2015-12-25 NOTE — MAU Note (Addendum)
Pt C/O excruciating pain in LLQ pain that started during the night yesterday, nonstop since 0400 this morning, also vomiting.  Denies diarrhea.  Period started Thursday night.

## 2015-12-25 NOTE — MAU Note (Signed)
States she has been seen several times in ED's and everything checked out OK. States she has continued to have abdominal pain, always on L side. States she has had some GI issues such as constipation as well. Hx substance abuse. States she has been clean for 3 years.

## 2015-12-25 NOTE — MAU Provider Note (Signed)
History     CSN: [id number]  Arrival date and time: 12/25/15 0705   First Provider Initiated Contact with Patient 12/25/15 [phone]      Chief Complaint  Patient presents with  . Abdominal Pain   HPI Stacey Ayala 30 y.o. Comes to MAU with severe LLQ pain which began at 4 am.  Is on 3rd day of menses.  Usually has pain on day 2 of menses.  Is very distressed about this pain.  This is a similar pain to what she had when at the ER in January.  They did not do an ultrasound but told her that she likely had a ruptured ovarian cyst.  She followed up with an OB/GYN in [facility] who did and ultrasound 3 days later and found no fluid in the pelvis.  The MD said it might be endometriosis.  Client has only taken extra strength tylenol for the pain and it has not decreased.  She did use a heating pad but was not able to go back to sleep.  Is concerned about what is causing the pain.  Has not tried contraceptive pills to help her with the pain as she desires a pregnancy.  She says she and her partner have been having unprotected sex for more than a year and she has not gotten pregnant.  Is worried that something is wrong and she will not be able to become pregnant.  Plans to call the MD in [facility] and get another appointment.  Speaks openly about her remission from substance abuse and states she does not want to torture herself by continuing in pain and her substance abuse sponsor is supportive of her receiving medications to control the pain.   OB History    Gravida Para Term Preterm AB TAB SAB Ectopic Multiple Living        Past Medical History  Diagnosis Date  . Substance abuse in remission   . Anxiety     History reviewed. No pertinent past surgical history.  Family History  Problem Relation Age of Onset  . Diabetes Mother   . Hypertension Mother     Social History  Substance Use Topics  . Smoking status: Current Every Day Smoker -- 0.50 packs/day    Types:  Cigarettes  . Smokeless tobacco: None  . Alcohol Use: No    Allergies: No Known Allergies  Prescriptions prior to admission  Medication Sig Dispense Refill Last Dose  . clonazePAM (KLONOPIN) 1 MG tablet Take 1 mg by mouth 2 (two) times daily.   12/25/2015 at Unknown time  . phentermine 37.5 MG capsule Take 18.75 mg by mouth every morning.    12/24/2015 at Unknown time  . VITAMIN K PO Take 1 tablet by mouth daily.   12/24/2015 at Unknown time  . diclofenac (VOLTAREN) 75 MG EC tablet Take 1 tablet (75 mg total) by mouth 2 (two) times daily. (Patient not taking: Reported on 12/25/2015) 30 tablet 2 Past Week at Unknown time  . oxyCODONE-acetaminophen (PERCOCET/ROXICET) 5-325 MG tablet 1 to 2 tabs PO q6hrs  PRN for pain (Patient not taking: Reported on 12/25/2015) 15 tablet 0   . senna-docusate (SENOKOT-S) 8.6-50 MG tablet Take 1 tablet by mouth daily. (Patient not taking: Reported on 12/25/2015) 30 tablet 0     Review of Systems  Constitutional: Negative for fever.  Gastrointestinal: Positive for nausea, vomiting and abdominal pain. Negative for diarrhea and constipation.  Genitourinary:  No vaginal discharge. Vaginal bleeding - on day 3 of menses. No dysuria.    Physical Exam   Blood pressure 114/68, pulse 92, temperature 97.6 F (36.4 C), temperature source Oral, resp. rate 18, height 5\' 9"  (1.753 m), weight 151 lb 9.6 oz (68.765 kg), last menstrual period 12/23/2015.  Physical Exam  Nursing note and vitals reviewed. Constitutional: She is oriented to person, place, and time. She appears well-developed and well-nourished.  Crying  HENT:  Head: Normocephalic.  Eyes: EOM are normal.  Neck: Neck supple.  Respiratory: Effort normal.  GI: Soft. There is tenderness. There is no rebound and no guarding.  Gentle abdominal palpation done.  Pain in very low LLQ almost in groin and in low midline.  States pain is radiating down into left leg.  Musculoskeletal: Normal range of motion.   Neurological: She is alert and oriented to person, place, and time.  Skin: Skin is warm and dry.  Psychiatric: She has a normal mood and affect.    MAU Course  Procedures  MDM 0810  Crying with pain.  Given Toradol 60 MG IM. Talked at length with client and advised that it would take a minimum of 30 minutes to see how the pain medication would work with the pain she was having.  Discussed the plan would be to do an ultrasound to see if we could see anything that would be causing her pain.  Advised that she would need continued follow up with her doctor to be able to have a plan to manage her intermittent severe pain.  Given a warm blanket to place on her abdomen and lowered the lights in the room at her request. [phone] - patient left AMA  Assessment and Plan  Left AMA  Tamar Miano 12/25/2015, 8:25 AM

## 2015-12-25 NOTE — MAU Note (Addendum)
Patient states she is going to leave. States pain med took the "edge off, from a 15 down to a 10." Informed patient that she will be going to U/S for further evaluation. Patient states she does not want to pay for that. States she just wanted her pain relieved, she can go home and be in pain. Informed patient that evaluation of her pelvis/abd was needed prior to additional pain medication. Patient still states she is not going to have U/S and is going home. AMA form signed.

## 2016-01-04 DIAGNOSIS — Z6821 Body mass index (BMI) 21.0-21.9, adult: Secondary | ICD-10-CM | POA: Diagnosis not present

## 2016-01-04 DIAGNOSIS — N946 Dysmenorrhea, unspecified: Secondary | ICD-10-CM | POA: Diagnosis not present

## 2016-01-10 DIAGNOSIS — F3289 Other specified depressive episodes: Secondary | ICD-10-CM | POA: Diagnosis not present

## 2016-01-10 DIAGNOSIS — F419 Anxiety disorder, unspecified: Secondary | ICD-10-CM | POA: Diagnosis not present

## 2016-01-10 DIAGNOSIS — L700 Acne vulgaris: Secondary | ICD-10-CM | POA: Diagnosis not present

## 2016-01-10 DIAGNOSIS — Z79899 Other long term (current) drug therapy: Secondary | ICD-10-CM | POA: Diagnosis not present

## 2016-02-09 DIAGNOSIS — F419 Anxiety disorder, unspecified: Secondary | ICD-10-CM | POA: Diagnosis not present

## 2016-02-09 DIAGNOSIS — L700 Acne vulgaris: Secondary | ICD-10-CM | POA: Diagnosis not present

## 2016-02-09 DIAGNOSIS — Z79899 Other long term (current) drug therapy: Secondary | ICD-10-CM | POA: Diagnosis not present

## 2016-02-09 DIAGNOSIS — F3289 Other specified depressive episodes: Secondary | ICD-10-CM | POA: Diagnosis not present

## 2016-03-08 DIAGNOSIS — F419 Anxiety disorder, unspecified: Secondary | ICD-10-CM | POA: Diagnosis not present

## 2016-03-08 DIAGNOSIS — F3289 Other specified depressive episodes: Secondary | ICD-10-CM | POA: Diagnosis not present

## 2016-03-08 DIAGNOSIS — R635 Abnormal weight gain: Secondary | ICD-10-CM | POA: Diagnosis not present

## 2016-03-08 DIAGNOSIS — L700 Acne vulgaris: Secondary | ICD-10-CM | POA: Diagnosis not present

## 2016-04-06 DIAGNOSIS — L700 Acne vulgaris: Secondary | ICD-10-CM | POA: Diagnosis not present

## 2016-04-06 DIAGNOSIS — F419 Anxiety disorder, unspecified: Secondary | ICD-10-CM | POA: Diagnosis not present

## 2016-04-06 DIAGNOSIS — F3289 Other specified depressive episodes: Secondary | ICD-10-CM | POA: Diagnosis not present

## 2016-04-06 DIAGNOSIS — Z79899 Other long term (current) drug therapy: Secondary | ICD-10-CM | POA: Diagnosis not present

## 2016-05-03 ENCOUNTER — Encounter (HOSPITAL_BASED_OUTPATIENT_CLINIC_OR_DEPARTMENT_OTHER): Payer: Self-pay | Admitting: Emergency Medicine

## 2016-05-03 ENCOUNTER — Emergency Department (HOSPITAL_BASED_OUTPATIENT_CLINIC_OR_DEPARTMENT_OTHER)
Admission: EM | Admit: 2016-05-03 | Discharge: 2016-05-03 | Disposition: A | Discharge status: Home / Self Care | Payer: BLUE CROSS/BLUE SHIELD | Attending: Physician Assistant | Admitting: Physician Assistant

## 2016-05-03 ENCOUNTER — Emergency Department (HOSPITAL_BASED_OUTPATIENT_CLINIC_OR_DEPARTMENT_OTHER): Payer: BLUE CROSS/BLUE SHIELD

## 2016-05-03 DIAGNOSIS — F17210 Nicotine dependence, cigarettes, uncomplicated: Secondary | ICD-10-CM | POA: Diagnosis not present

## 2016-05-03 DIAGNOSIS — R102 Pelvic and perineal pain: Secondary | ICD-10-CM

## 2016-05-03 DIAGNOSIS — R1032 Left lower quadrant pain: Secondary | ICD-10-CM

## 2016-05-03 DIAGNOSIS — N939 Abnormal uterine and vaginal bleeding, unspecified: Secondary | ICD-10-CM | POA: Diagnosis not present

## 2016-05-03 LAB — URINALYSIS, ROUTINE W REFLEX MICROSCOPIC
BILIRUBIN URINE: NEGATIVE
Glucose, UA: NEGATIVE mg/dL
Ketones, ur: 15 mg/dL — AB
Nitrite: NEGATIVE
Protein, ur: NEGATIVE mg/dL
SPECIFIC GRAVITY, URINE: 1.015 (ref 1.005–1.030)
pH: 6 (ref 5.0–8.0)

## 2016-05-03 LAB — URINE MICROSCOPIC-ADD ON

## 2016-05-03 LAB — WET PREP, GENITAL
Clue Cells Wet Prep HPF POC: NONE SEEN
Sperm: NONE SEEN
Trich, Wet Prep: NONE SEEN
Yeast Wet Prep HPF POC: NONE SEEN

## 2016-05-03 LAB — PREGNANCY, URINE: PREG TEST UR: NEGATIVE

## 2016-05-03 MED ORDER — OXYCODONE-ACETAMINOPHEN 5-325 MG PO TABS
1.0000 | ORAL_TABLET | Freq: Once | ORAL | Status: AC
  Administered 2016-05-03: 1 via ORAL
  Filled 2016-05-03: qty 1

## 2016-05-03 NOTE — ED Notes (Signed)
Pt requested to chew percocet. Instructed pt to swallow with water due to the taste if chewed.

## 2016-05-03 NOTE — ED Triage Notes (Signed)
Pt states she has had the pain since January but last night got worse.  Seeing a specialist but can't get in til later

## 2016-05-03 NOTE — Discharge Instructions (Signed)
We are unsure what is causing this chronic pain.  Please follow up with your PCP, your obgyn for pain medication and further workup.

## 2016-05-03 NOTE — ED Notes (Signed)
Patient transported to Ultrasound 

## 2016-05-03 NOTE — ED Provider Notes (Signed)
[facility] Provider Note   CSN: [id number] Arrival date & time: 05/03/16  1311  First Provider Contact:  First MD Initiated Contact with Patient 05/03/16 1324        History   Chief Complaint Chief Complaint  Patient presents with  . Abdominal Pain    HPI Stacey Ayala is a 30 y.o. female.  HPI   Patient is a 30 year old female presenting here with chronic abdominal pain. Patient has chronic left lower quadrant pain since January. She seen multiple specialists for this. Including multiple OB/GYN's. She reports that on her cycle she gets the building pain that radiates from her left lower quadrant on her leg. Patient is a "recovering addict". However she stated "my sponsor says that I can have pain medication for this".  Patient is currently menstruating and has developed the pain found it intolerable works at came here for further workup. She states "I can't live like this this pain".  Patient's had nauseanovomitingnodiarrhea.Patientabletospeakfullsentences.  There is nothing new about this pain today. Patient states that the same chronic pain that she gets every month. It started this morning at 7:00.  Past Medical History:  Diagnosis Date  . Anxiety   . Substance abuse in remission     There are no active problems to display for this patient.   History reviewed. No pertinent surgical history.  OB History    Gravida Para Term Preterm AB Living   0 0 0 0 0 0   SAB TAB Ectopic Multiple Live Births   0 0 0 0         Home Medications    Prior to Admission medications   Medication Sig Start Date End Date Taking? Authorizing Provider  clonazePAM (KLONOPIN) 1 MG tablet Take 1 mg by mouth 2 (two) times daily.    Historical Provider, MD  phentermine 37.5 MG capsule Take 18.75 mg by mouth every morning.     Historical Provider, MD  senna-docusate (SENOKOT-S) 8.6-50 MG tablet Take 1 tablet by mouth daily. Patient not taking: Reported on 12/25/2015  10/16/15   Joni Reining Pisciotta, PA-C  VITAMIN K PO Take 1 tablet by mouth daily.    Historical Provider, MD    Family History Family History  Problem Relation Age of Onset  . Diabetes Mother   . Hypertension Mother     Social History Social History  Substance Use Topics  . Smoking status: Current Every Day Smoker    Packs/day: 0.50    Types: Cigarettes  . Smokeless tobacco: Not on file  . Alcohol use No     Allergies   Review of patient's allergies indicates no known allergies.   Review of Systems Review of Systems  Constitutional: Negative for activity change.  Respiratory: Negative for shortness of breath.   Cardiovascular: Negative for chest pain.  Gastrointestinal: Negative for abdominal pain.  Genitourinary: Positive for pelvic pain and vaginal bleeding. Negative for difficulty urinating, dyspareunia, dysuria and menstrual problem.  All other systems reviewed and are negative.    Physical Exam Updated Vital Signs BP 120/68 (BP Location: Right Arm)   Pulse 88   Temp 98.4 F (36.9 C) (Oral)   Resp 18   Ht 5\' 10"  (1.778 m)   Wt 150 lb (68 kg)   LMP 05/03/2016   SpO2 100%   BMI 21.52 kg/m   Physical Exam  Constitutional: She is oriented to person, place, and time. She appears well-developed and well-nourished.  HENT:  Head: Normocephalic and atraumatic.  Eyes: Right eye exhibits no discharge.  Cardiovascular: Normal rate, regular rhythm and normal heart sounds.   No murmur heard. Pulmonary/Chest: Effort normal and breath sounds normal. She has no wheezes. She has no rales.  Abdominal: Soft. She exhibits no distension. There is no tenderness.  L adnexal pain  Neurological: She is oriented to person, place, and time.  Skin: Skin is warm and dry. She is not diaphoretic.  Psychiatric: She has a normal mood and affect.  Nursing note and vitals reviewed.    ED Treatments / Results  Labs (all labs ordered are listed, but only abnormal results are  displayed) Labs Reviewed  WET PREP, GENITAL - Abnormal; Notable for the following:       Result Value   WBC, Wet Prep HPF POC MODERATE (*)    All other components within normal limits  URINALYSIS, ROUTINE W REFLEX MICROSCOPIC (NOT AT [facility]) - Abnormal; Notable for the following:    APPearance CLOUDY (*)    Hgb urine dipstick LARGE (*)    Ketones, ur 15 (*)    Leukocytes, UA TRACE (*)    All other components within normal limits  URINE MICROSCOPIC-ADD ON - Abnormal; Notable for the following:    Squamous Epithelial / LPF 0-5 (*)    Bacteria, UA MANY (*)    All other components within normal limits  URINE CULTURE  PREGNANCY, URINE  GC/CHLAMYDIA PROBE AMP ([place]) NOT AT [facility]    EKG  EKG Interpretation None       Radiology US Transvaginal Non-ob  Result Date: 05/03/2016 CLINICAL DATA:  Left lower quadrant pain and beginning of menses over the last 8 months. EXAM: TRANSABDOMINAL AND TRANSVAGINAL ULTRASOUND OF PELVIS TECHNIQUE: Both transabdominal and transvaginal ultrasound examinations of the pelvis were performed. Transabdominal technique was performed for global imaging of the pelvis including uterus, ovaries, adnexal regions, and pelvic cul-de-sac. It was necessary to proceed with endovaginal exam following the transabdominal exam to visualize the ovaries and endometrium. COMPARISON:  CT of the abdomen and pelvis 10/16/2015 FINDINGS: Uterus Measurements: 7.7 x 3.8 x 3.7 cm, within normal limits. No fibroids or other mass visualized. Endometrium Thickness: 7.3 mm, within normal limits. No focal abnormality visualized. Right ovary Not visualized.  Overlying bowel is noted. Left ovary Measurements: 2.4 x 1.7 x 1.5 cm, within normal limits. Normal appearance/no adnexal mass. Other findings No significant free fluid. IMPRESSION: 1. Normal sonographic appearance of the uterus and left ovary. 2. No acute or focal lesion to explain left lower quadrant abdominal pain. 3. The right ovary is  not discretely visualized. Electronically Signed   By: Marin Roberts M.D.   On: 05/03/2016 15:18   US Pelvis Complete  Result Date: 05/03/2016 CLINICAL DATA:  Left lower quadrant pain and beginning of menses over the last 8 months. EXAM: TRANSABDOMINAL AND TRANSVAGINAL ULTRASOUND OF PELVIS TECHNIQUE: Both transabdominal and transvaginal ultrasound examinations of the pelvis were performed. Transabdominal technique was performed for global imaging of the pelvis including uterus, ovaries, adnexal regions, and pelvic cul-de-sac. It was necessary to proceed with endovaginal exam following the transabdominal exam to visualize the ovaries and endometrium. COMPARISON:  CT of the abdomen and pelvis 10/16/2015 FINDINGS: Uterus Measurements: 7.7 x 3.8 x 3.7 cm, within normal limits. No fibroids or other mass visualized. Endometrium Thickness: 7.3 mm, within normal limits. No focal abnormality visualized. Right ovary Not visualized.  Overlying bowel is noted. Left ovary Measurements: 2.4 x 1.7 x 1.5 cm, within normal limits. Normal appearance/no adnexal mass.  Other findings No significant free fluid. IMPRESSION: 1. Normal sonographic appearance of the uterus and left ovary. 2. No acute or focal lesion to explain left lower quadrant abdominal pain. 3. The right ovary is not discretely visualized. Electronically Signed   By: Marin Roberts M.D.   On: 05/03/2016 15:18    Procedures Procedures (including critical care time)  Medications Ordered in ED Medications  oxyCODONE-acetaminophen (PERCOCET/ROXICET) 5-325 MG per tablet 1 tablet (1 tablet Oral Given 05/03/16 1405)     Initial Impression / Assessment and Plan / ED Course  I have reviewed the triage vital signs and the nursing notes.  Pertinent labs & imaging results that were available during my care of the patient were reviewed by me and considered in my medical decision making (see chart for details).  Clinical Course    Patient is a  30 year old female presenting with left adnexal pain. She states the same pain that she usually has. She has it every month. She seen multiple specialists 4. However she's never got an ultrasound during the episode of the pain. She thinks that may be she has episode and has the ultrasound at the same time that they would be able to see a cyst or other etiology. Patient thinks that she has endometriosis and wants to have laparoscopic but her OB does not want to do that this time.   We'll give her 1 Percocet orally today. Order transvaginal ultrasound. And get urine.   3:32 PM Work up negative. Will have her follow up with her pcp, obgyn for this chronic issue.    Final Clinical Impressions(s) / ED Diagnoses   Final diagnoses:  Pain, abdominal, LLQ  Pelvic pain in female    New Prescriptions New Prescriptions   No medications on file     Vernadine Coombs Randall An, MD 05/03/16 [phone]

## 2016-05-04 DIAGNOSIS — K5900 Constipation, unspecified: Secondary | ICD-10-CM | POA: Diagnosis not present

## 2016-05-04 DIAGNOSIS — L700 Acne vulgaris: Secondary | ICD-10-CM | POA: Diagnosis not present

## 2016-05-04 DIAGNOSIS — F3289 Other specified depressive episodes: Secondary | ICD-10-CM | POA: Diagnosis not present

## 2016-05-04 DIAGNOSIS — R109 Unspecified abdominal pain: Secondary | ICD-10-CM | POA: Diagnosis not present

## 2016-05-04 LAB — URINE CULTURE: Culture: <10000 — AB

## 2016-05-04 LAB — GC/CHLAMYDIA PROBE AMP (~~LOC~~) NOT AT ARMC
CHLAMYDIA, DNA PROBE: NEGATIVE
NEISSERIA GONORRHEA: NEGATIVE

## 2016-05-08 DIAGNOSIS — K5900 Constipation, unspecified: Secondary | ICD-10-CM | POA: Diagnosis not present

## 2016-05-08 DIAGNOSIS — R109 Unspecified abdominal pain: Secondary | ICD-10-CM | POA: Diagnosis not present

## 2016-05-08 DIAGNOSIS — Z79899 Other long term (current) drug therapy: Secondary | ICD-10-CM | POA: Diagnosis not present

## 2016-05-10 DIAGNOSIS — N944 Primary dysmenorrhea: Secondary | ICD-10-CM | POA: Diagnosis not present

## 2016-05-10 DIAGNOSIS — R1032 Left lower quadrant pain: Secondary | ICD-10-CM | POA: Diagnosis not present

## 2016-05-16 ENCOUNTER — Other Ambulatory Visit: Payer: Self-pay | Admitting: Obstetrics & Gynecology

## 2016-05-17 ENCOUNTER — Other Ambulatory Visit: Payer: Self-pay | Admitting: Obstetrics & Gynecology

## 2016-05-23 NOTE — Patient Instructions (Addendum)
Your procedure is scheduled on:  Thursday, May 25, 2016  Enter through the Main Entrance of [facility] at:  10:00 AM  Pick up the phone at the desk and dial [phone].  Call this number if you have problems the morning of surgery: [phone].  Remember: Do NOT eat food or drink after:  Midnight tonight  Take these medicines the morning of surgery with a SIP OF WATER:  Clonazepam  Do NOT smoke the day of surgery  Do NOT wear jewelry (body piercing), metal hair clips/bobby pins, make-up, or nail polish. Do NOT wear lotions, powders, or perfumes.  You may wear deodorant. Do NOT shave for 48 hours prior to surgery. Do NOT bring valuables to the hospital. Contacts, dentures, or bridgework may not be worn into surgery.  Have a responsible adult drive you home and stay with you for 24 hours after your procedure

## 2016-05-24 ENCOUNTER — Encounter (HOSPITAL_COMMUNITY): Payer: Self-pay

## 2016-05-24 ENCOUNTER — Encounter (HOSPITAL_COMMUNITY)
Admission: RE | Admit: 2016-05-24 | Discharge: 2016-05-24 | Disposition: A | Discharge status: Home / Self Care | Payer: BLUE CROSS/BLUE SHIELD | Source: Ambulatory Visit | Attending: Obstetrics & Gynecology | Admitting: Obstetrics & Gynecology

## 2016-05-24 DIAGNOSIS — N946 Dysmenorrhea, unspecified: Secondary | ICD-10-CM | POA: Diagnosis not present

## 2016-05-24 DIAGNOSIS — R102 Pelvic and perineal pain: Secondary | ICD-10-CM | POA: Diagnosis not present

## 2016-05-24 DIAGNOSIS — F17210 Nicotine dependence, cigarettes, uncomplicated: Secondary | ICD-10-CM | POA: Diagnosis not present

## 2016-05-24 DIAGNOSIS — F419 Anxiety disorder, unspecified: Secondary | ICD-10-CM | POA: Diagnosis not present

## 2016-05-24 HISTORY — DX: Headache: R51

## 2016-05-24 HISTORY — DX: Other difficulties with micturition: R39.198

## 2016-05-24 HISTORY — DX: Headache, unspecified: R51.9

## 2016-05-24 HISTORY — DX: Personal history of other infectious and parasitic diseases: Z86.19

## 2016-05-24 LAB — CBC
HEMATOCRIT: 37.9 % (ref 36.0–46.0)
HEMOGLOBIN: 13 g/dL (ref 12.0–15.0)
MCH: 30.4 pg (ref 26.0–34.0)
MCHC: 34.3 g/dL (ref 30.0–36.0)
MCV: 88.6 fL (ref 78.0–100.0)
Platelets: 267 10*3/uL (ref 150–400)
RBC: 4.28 MIL/uL (ref 3.87–5.11)
RDW: 12.6 % (ref 11.5–15.5)
WBC: 9.8 10*3/uL (ref 4.0–10.5)

## 2016-05-24 MED ORDER — CEFAZOLIN SODIUM-DEXTROSE 2-4 GM/100ML-% IV SOLN
2.0000 g | INTRAVENOUS | Status: AC
  Administered 2016-05-25: 2 g via INTRAVENOUS

## 2016-05-25 ENCOUNTER — Ambulatory Visit (HOSPITAL_COMMUNITY): Payer: BLUE CROSS/BLUE SHIELD | Admitting: Anesthesiology

## 2016-05-25 ENCOUNTER — Encounter (HOSPITAL_COMMUNITY)
Admission: RE | Disposition: A | Discharge status: Home / Self Care | Payer: Self-pay | Source: Ambulatory Visit | Attending: Obstetrics & Gynecology

## 2016-05-25 ENCOUNTER — Encounter (HOSPITAL_COMMUNITY): Payer: Self-pay | Admitting: Anesthesiology

## 2016-05-25 ENCOUNTER — Ambulatory Visit (HOSPITAL_COMMUNITY)
Admission: RE | Admit: 2016-05-25 | Discharge: 2016-05-25 | Disposition: A | Discharge status: Home / Self Care | Payer: BLUE CROSS/BLUE SHIELD | Source: Ambulatory Visit | Attending: Obstetrics & Gynecology | Admitting: Obstetrics & Gynecology

## 2016-05-25 DIAGNOSIS — N946 Dysmenorrhea, unspecified: Secondary | ICD-10-CM | POA: Insufficient documentation

## 2016-05-25 DIAGNOSIS — R102 Pelvic and perineal pain: Secondary | ICD-10-CM | POA: Diagnosis not present

## 2016-05-25 DIAGNOSIS — F17210 Nicotine dependence, cigarettes, uncomplicated: Secondary | ICD-10-CM | POA: Diagnosis not present

## 2016-05-25 DIAGNOSIS — F419 Anxiety disorder, unspecified: Secondary | ICD-10-CM | POA: Diagnosis not present

## 2016-05-25 DIAGNOSIS — N944 Primary dysmenorrhea: Secondary | ICD-10-CM | POA: Diagnosis not present

## 2016-05-25 DIAGNOSIS — N736 Female pelvic peritoneal adhesions (postinfective): Secondary | ICD-10-CM | POA: Diagnosis not present

## 2016-05-25 HISTORY — PX: CHROMOPERTUBATION: SHX6288

## 2016-05-25 HISTORY — PX: LAPAROSCOPY: SHX197

## 2016-05-25 LAB — PREGNANCY, URINE: PREG TEST UR: NEGATIVE

## 2016-05-25 SURGERY — LAPAROSCOPY, DIAGNOSTIC
Anesthesia: General | Site: Uterus

## 2016-05-25 MED ORDER — DEXAMETHASONE SODIUM PHOSPHATE 10 MG/ML IJ SOLN
INTRAMUSCULAR | Status: DC | PRN
  Administered 2016-05-25: 4 mg via INTRAVENOUS

## 2016-05-25 MED ORDER — KETOROLAC TROMETHAMINE 30 MG/ML IJ SOLN
INTRAMUSCULAR | Status: DC | PRN
  Administered 2016-05-25: 30 mg via INTRAVENOUS

## 2016-05-25 MED ORDER — LIDOCAINE HCL (CARDIAC) 20 MG/ML IV SOLN
INTRAVENOUS | Status: AC
  Filled 2016-05-25: qty 5

## 2016-05-25 MED ORDER — SODIUM CHLORIDE 0.9 % IJ SOLN
INTRAMUSCULAR | Status: AC
  Filled 2016-05-25: qty 50

## 2016-05-25 MED ORDER — ROCURONIUM BROMIDE 100 MG/10ML IV SOLN
INTRAVENOUS | Status: AC
  Filled 2016-05-25: qty 1

## 2016-05-25 MED ORDER — ROCURONIUM BROMIDE 100 MG/10ML IV SOLN
INTRAVENOUS | Status: DC | PRN
  Administered 2016-05-25: 35 mg via INTRAVENOUS
  Administered 2016-05-25: 15 mg via INTRAVENOUS

## 2016-05-25 MED ORDER — OXYCODONE HCL 5 MG PO TABS
ORAL_TABLET | ORAL | Status: AC
  Administered 2016-05-25: 5 mg
  Filled 2016-05-25: qty 1

## 2016-05-25 MED ORDER — DEXAMETHASONE SODIUM PHOSPHATE 4 MG/ML IJ SOLN
INTRAMUSCULAR | Status: AC
  Filled 2016-05-25: qty 1

## 2016-05-25 MED ORDER — MIDAZOLAM HCL 2 MG/2ML IJ SOLN
INTRAMUSCULAR | Status: AC
  Filled 2016-05-25: qty 2

## 2016-05-25 MED ORDER — MIDAZOLAM HCL 2 MG/2ML IJ SOLN
INTRAMUSCULAR | Status: DC | PRN
  Administered 2016-05-25: 2 mg via INTRAVENOUS

## 2016-05-25 MED ORDER — BUPIVACAINE HCL (PF) 0.25 % IJ SOLN
INTRAMUSCULAR | Status: DC | PRN
  Administered 2016-05-25 (×2): 10 mL

## 2016-05-25 MED ORDER — PROMETHAZINE HCL 25 MG/ML IJ SOLN: 6.2500 mg | INTRAMUSCULAR | Status: DC | PRN

## 2016-05-25 MED ORDER — METHYLENE BLUE 0.5 % INJ SOLN
INTRAVENOUS | Status: DC | PRN
Start: 2016-05-25 — End: 2016-05-25
  Administered 2016-05-25: 10 mL

## 2016-05-25 MED ORDER — FENTANYL CITRATE (PF) 100 MCG/2ML IJ SOLN
INTRAMUSCULAR | Status: DC | PRN
  Administered 2016-05-25 (×4): 50 ug via INTRAVENOUS

## 2016-05-25 MED ORDER — SUGAMMADEX SODIUM 500 MG/5ML IV SOLN
INTRAVENOUS | Status: DC | PRN
  Administered 2016-05-25: 400 mg via INTRAVENOUS

## 2016-05-25 MED ORDER — KETOROLAC TROMETHAMINE 30 MG/ML IJ SOLN
INTRAMUSCULAR | Status: AC
  Filled 2016-05-25: qty 1

## 2016-05-25 MED ORDER — LACTATED RINGERS IV SOLN
INTRAVENOUS | Status: DC
Start: 2016-05-25 — End: 2016-05-25
  Administered 2016-05-25: 11:00:00 via INTRAVENOUS
  Administered 2016-05-25: 125 mL/h via INTRAVENOUS

## 2016-05-25 MED ORDER — ROPIVACAINE HCL 5 MG/ML IJ SOLN
INTRAMUSCULAR | Status: AC
  Filled 2016-05-25: qty 30

## 2016-05-25 MED ORDER — ONDANSETRON HCL 4 MG/2ML IJ SOLN
INTRAMUSCULAR | Status: DC | PRN
  Administered 2016-05-25: 4 mg via INTRAVENOUS

## 2016-05-25 MED ORDER — FENTANYL CITRATE (PF) 100 MCG/2ML IJ SOLN
INTRAMUSCULAR | Status: AC
  Administered 2016-05-25: 50 ug via INTRAVENOUS
  Filled 2016-05-25: qty 2

## 2016-05-25 MED ORDER — BUPIVACAINE HCL (PF) 0.25 % IJ SOLN
INTRAMUSCULAR | Status: AC
  Filled 2016-05-25: qty 60

## 2016-05-25 MED ORDER — ARTIFICIAL TEARS OP OINT
TOPICAL_OINTMENT | OPHTHALMIC | Status: AC
  Filled 2016-05-25: qty 3.5

## 2016-05-25 MED ORDER — FENTANYL CITRATE (PF) 100 MCG/2ML IJ SOLN
25.0000 ug | INTRAMUSCULAR | Status: DC | PRN
  Administered 2016-05-25 (×2): 50 ug via INTRAVENOUS

## 2016-05-25 MED ORDER — ACETAMINOPHEN 10 MG/ML IV SOLN
1000.0000 mg | Freq: Once | INTRAVENOUS | Status: AC
  Administered 2016-05-25: 1000 mg via INTRAVENOUS
  Filled 2016-05-25: qty 100

## 2016-05-25 MED ORDER — SCOPOLAMINE 1 MG/3DAYS TD PT72
MEDICATED_PATCH | TRANSDERMAL | Status: AC
  Administered 2016-05-25: 1.5 mg via TRANSDERMAL
  Filled 2016-05-25: qty 1

## 2016-05-25 MED ORDER — PROPOFOL 10 MG/ML IV BOLUS
INTRAVENOUS | Status: DC | PRN
  Administered 2016-05-25: 170 mg via INTRAVENOUS

## 2016-05-25 MED ORDER — SCOPOLAMINE 1 MG/3DAYS TD PT72
1.0000 | MEDICATED_PATCH | Freq: Once | TRANSDERMAL | Status: DC
  Administered 2016-05-25: 1.5 mg via TRANSDERMAL

## 2016-05-25 MED ORDER — FENTANYL CITRATE (PF) 250 MCG/5ML IJ SOLN
INTRAMUSCULAR | Status: AC
  Filled 2016-05-25: qty 5

## 2016-05-25 MED ORDER — OXYCODONE-ACETAMINOPHEN 7.5-325 MG PO TABS: 1.0000 | ORAL_TABLET | ORAL | 0 refills | Status: DC | PRN

## 2016-05-25 MED ORDER — ONDANSETRON HCL 4 MG/2ML IJ SOLN
INTRAMUSCULAR | Status: AC
  Filled 2016-05-25: qty 2

## 2016-05-25 MED ORDER — PROPOFOL 10 MG/ML IV BOLUS
INTRAVENOUS | Status: AC
  Filled 2016-05-25: qty 20

## 2016-05-25 MED ORDER — LIDOCAINE HCL (CARDIAC) 20 MG/ML IV SOLN
INTRAVENOUS | Status: DC | PRN
  Administered 2016-05-25: 70 mg via INTRAVENOUS
  Administered 2016-05-25: 30 mg via INTRAVENOUS

## 2016-05-25 MED ORDER — EPHEDRINE 5 MG/ML INJ
INTRAVENOUS | Status: AC
  Filled 2016-05-25: qty 10

## 2016-05-25 MED ORDER — METHYLENE BLUE 0.5 % INJ SOLN
INTRAVENOUS | Status: AC
  Filled 2016-05-25: qty 10

## 2016-05-25 MED ORDER — EPHEDRINE SULFATE 50 MG/ML IJ SOLN
INTRAMUSCULAR | Status: DC | PRN
  Administered 2016-05-25: 15 mg via INTRAVENOUS

## 2016-05-25 MED ORDER — PHENYLEPHRINE HCL 10 MG/ML IJ SOLN
INTRAMUSCULAR | Status: DC | PRN
  Administered 2016-05-25: 120 ug via INTRAVENOUS
  Administered 2016-05-25: 80 ug via INTRAVENOUS

## 2016-05-25 SURGICAL SUPPLY — 61 items
APPLICATOR COTTON TIP 6IN STRL (MISCELLANEOUS) IMPLANT
BARRIER ADHS 3X4 INTERCEED (GAUZE/BANDAGES/DRESSINGS) IMPLANT
CABLE HIGH FREQUENCY MONO STRZ (ELECTRODE) ×4 IMPLANT
CATH ROBINSON RED A/P 16FR (CATHETERS) IMPLANT
CLOTH BEACON ORANGE TIMEOUT ST (SAFETY) ×4 IMPLANT
CONT PATH 16OZ SNAP LID 3702 (MISCELLANEOUS) IMPLANT
COVER BACK TABLE 60X90IN (DRAPES) ×8 IMPLANT
COVER TIP SHEARS 8 DVNC (MISCELLANEOUS) ×3 IMPLANT
COVER TIP SHEARS 8MM DA VINCI (MISCELLANEOUS) ×1
DECANTER SPIKE VIAL GLASS SM (MISCELLANEOUS) ×4 IMPLANT
DEFOGGER SCOPE WARMER CLEARIFY (MISCELLANEOUS) ×4 IMPLANT
DRSG COVADERM PLUS 2X2 (GAUZE/BANDAGES/DRESSINGS) IMPLANT
DRSG OPSITE POSTOP 3X4 (GAUZE/BANDAGES/DRESSINGS) ×4 IMPLANT
DURAPREP 26ML APPLICATOR (WOUND CARE) ×4 IMPLANT
ELECT REM PT RETURN 9FT ADLT (ELECTROSURGICAL) ×4
ELECTRODE REM PT RTRN 9FT ADLT (ELECTROSURGICAL) ×3 IMPLANT
GAUZE VASELINE 3X9 (GAUZE/BANDAGES/DRESSINGS) IMPLANT
GLOVE BIO SURGEON STRL SZ 6.5 (GLOVE) ×12 IMPLANT
GLOVE BIOGEL PI IND STRL 7.0 (GLOVE) ×18 IMPLANT
GLOVE BIOGEL PI INDICATOR 7.0 (GLOVE) ×6
GOWN STRL REUS W/TWL LRG LVL3 (GOWN DISPOSABLE) ×12 IMPLANT
IV STOPCOCK 4 WAY 40  W/Y SET (IV SOLUTION)
IV STOPCOCK 4 WAY 40 W/Y SET (IV SOLUTION) IMPLANT
KIT ACCESSORY DA VINCI DISP (KITS)
KIT ACCESSORY DVNC DISP (KITS) IMPLANT
LEGGING LITHOTOMY PAIR STRL (DRAPES) IMPLANT
LIQUID BAND (GAUZE/BANDAGES/DRESSINGS) ×4 IMPLANT
MANIPULATOR UTERINE 4.5 ZUMI (MISCELLANEOUS) IMPLANT
NEEDLE HYPO 22GX1.5 SAFETY (NEEDLE) ×4 IMPLANT
PACK LAPAROSCOPY BASIN (CUSTOM PROCEDURE TRAY) ×4 IMPLANT
PACK ROBOT WH (CUSTOM PROCEDURE TRAY) ×4 IMPLANT
PACK ROBOTIC GOWN (GOWN DISPOSABLE) ×4 IMPLANT
PAD PREP 24X48 CUFFED NSTRL (MISCELLANEOUS) ×8 IMPLANT
PAD TRENDELENBURG POSITION (MISCELLANEOUS) ×4 IMPLANT
PENCIL BUTTON HOLSTER BLD 10FT (ELECTRODE) IMPLANT
SET CYSTO W/LG BORE CLAMP LF (SET/KITS/TRAYS/PACK) IMPLANT
SET IRRIG TUBING LAPAROSCOPIC (IRRIGATION / IRRIGATOR) ×4 IMPLANT
SET TRI-LUMEN FLTR TB AIRSEAL (TUBING) ×4 IMPLANT
SLEEVE XCEL OPT CAN 5 100 (ENDOMECHANICALS) IMPLANT
SUT MNCRL AB 4-0 PS2 18 (SUTURE) ×8 IMPLANT
SUT VIC AB 0 CT2 27 (SUTURE) ×8 IMPLANT
SUT VIC AB 4-0 PS2 27 (SUTURE) ×8 IMPLANT
SUT VICRYL 0 UR6 27IN ABS (SUTURE) ×8 IMPLANT
SYR 50ML LL SCALE MARK (SYRINGE) ×8 IMPLANT
SYSTEM CONVERTIBLE TROCAR (TROCAR) ×4 IMPLANT
TIP UTERINE 5.1X6CM LAV DISP (MISCELLANEOUS) ×4 IMPLANT
TIP UTERINE 6.7X10CM GRN DISP (MISCELLANEOUS) IMPLANT
TIP UTERINE 6.7X6CM WHT DISP (MISCELLANEOUS) IMPLANT
TIP UTERINE 6.7X8CM BLUE DISP (MISCELLANEOUS) ×4 IMPLANT
TOWEL OR 17X24 6PK STRL BLUE (TOWEL DISPOSABLE) ×12 IMPLANT
TRAY FOLEY CATH SILVER 14FR (SET/KITS/TRAYS/PACK) ×4 IMPLANT
TRAY FOLEY CATH SILVER 16FR (SET/KITS/TRAYS/PACK) IMPLANT
TROCAR 12M 150ML BLUNT (TROCAR) ×4 IMPLANT
TROCAR BALLN 12MMX100 BLUNT (TROCAR) ×4 IMPLANT
TROCAR DISP BLADELESS 8 DVNC (TROCAR) ×3 IMPLANT
TROCAR DISP BLADELESS 8MM (TROCAR) ×1
TROCAR PORT AIRSEAL 5X120 (TROCAR) IMPLANT
TROCAR XCEL NON-BLD 11X100MML (ENDOMECHANICALS) IMPLANT
TROCAR XCEL NON-BLD 5MMX100MML (ENDOMECHANICALS) ×4 IMPLANT
TROCAR XCEL OPT SLVE 5M 100M (ENDOMECHANICALS) ×4 IMPLANT
WATER STERILE IRR 1000ML POUR (IV SOLUTION) ×4 IMPLANT

## 2016-05-25 NOTE — H&P (Signed)
Stacey BolognaBrittany Ayala is an 30 y.o. female G0P0000  RP:  Persistent Lt Pelvic pain, Dysmenorrhea  Pertinent Gynecological History: Menses: flow is moderate Contraception: BCPs Blood transfusions: none Sexually transmitted diseases: Hep C Previous GYN Procedures: None  Last pap: normal  OB History: G0P0000   Menstrual History:  Patient's last menstrual period was 05/02/2016 (exact date).    Past Medical History:  Diagnosis Date  . Anxiety   . Headache    Migraines  . History of hepatitis    Hep C  . Substance abuse in remission   . Voiding difficulty    push to empty bladder    History reviewed. No pertinent surgical history.  Family History  Problem Relation Age of Onset  . Diabetes Mother   . Hypertension Mother     Social History:  reports that she has been smoking Cigarettes.  She has been smoking about 0.50 packs per day. She has never used smokeless tobacco. She reports that she does not drink alcohol. Her drug history is not on file.  Allergies: No Known Allergies  Prescriptions Prior to Admission  Medication Sig Dispense Refill Last Dose  . clonazePAM (KLONOPIN) 1 MG tablet Take 1 mg by mouth 3 (three) times daily as needed for anxiety.    05/25/2016 at 0650  . traMADol (ULTRAM) 50 MG tablet Take 50 mg by mouth 2 (two) times daily as needed for pain.  0 05/24/2016 at 2200  . FALMINA 0.1-20 MG-MCG tablet Take 1 tablet by mouth at bedtime.  1 Unknown at Unknown time    ROS Neg  Blood pressure 111/70, pulse 83, temperature 97.6 F (36.4 C), resp. rate 16, last menstrual period 05/02/2016, SpO2 97 %. Physical Exam  Results for orders placed or performed during the hospital encounter of 05/25/16 (from the past 24 hour(s))  Pregnancy, urine     Status: None   Collection Time: 05/25/16  8:10 AM  Result Value Ref Range   Preg Test, Ur NEGATIVE NEGATIVE    Assessment/Plan: Persistent Lt Pelvic Pain with Dysmenorrhea for Dx LPS,  Methylene Blue Hydrotubation,  possible lysis of adhesions, possible conservative treatment of endometriosis, possible Robotic assistance.  Stacey Ayala,Stacey Ayala 05/25/2016, 8:45 AM

## 2016-05-25 NOTE — Anesthesia Postprocedure Evaluation (Signed)
Anesthesia Post Note  Patient: Stacey BolognaBrittany Ayala  Procedure(s) Performed: Procedure(s) (LRB): LAPAROSCOPY DIAGNOSTIC/Lysis of Adhesion (N/A) CHROMOPERTUBATION/Methylene Blue Hydrotubation (Bilateral)  Patient location during evaluation: PACU Anesthesia Type: General Level of consciousness: awake and alert Pain management: pain level controlled Vital Signs Assessment: post-procedure vital signs reviewed and stable Respiratory status: spontaneous breathing, nonlabored ventilation and respiratory function stable Cardiovascular status: blood pressure returned to baseline and stable Postop Assessment: no signs of nausea or vomiting Anesthetic complications: no     Last Vitals:  Vitals:   05/25/16 1145 05/25/16 1150  BP: 106/71   Pulse: 82 80  Resp: 20 19  Temp:      Last Pain:  Vitals:   05/25/16 0823  PainSc: 3    Pain Goal: Patients Stated Pain Goal: 3 (05/25/16 0823)               Linton RumpJennifer Dickerson Kimberleigh Mehan

## 2016-05-25 NOTE — Discharge Summary (Signed)
  Physician Discharge Summary  Patient ID: Stacey Ayala MRN: [id number] DOB/AGE: 28/12/1985 28 y.o.  Admit date: 05/25/2016 Discharge date: 05/25/2016  Admission Diagnoses: Chronic Left Pelvic Pain, Dysmenorrhea  Discharge Diagnoses: Chronic Left Pelvic Pain, Dysmenorrhea, Bowel Adhesion        Active Problems:   * No active hospital problems. *   Discharged Condition: good  Hospital Course: Outpatient  Consults: None  Treatments: surgery: Laparoscopic Lysis of Adhesion, Methylene Blue Hydrotubation  Disposition: 01-Home or Self Care     Medication List    TAKE these medications   clonazePAM 1 MG tablet Commonly known as:  KLONOPIN Take 1 mg by mouth 3 (three) times daily as needed for anxiety.   FALMINA 0.1-20 MG-MCG tablet Generic drug:  levonorgestrel-ethinyl estradiol Take 1 tablet by mouth at bedtime.   oxyCODONE-acetaminophen 7.5-325 MG tablet Commonly known as:  PERCOCET Take 1 tablet by mouth every 4 (four) hours as needed for severe pain.   traMADol 50 MG tablet Commonly known as:  ULTRAM Take 50 mg by mouth 2 (two) times daily as needed for pain.      Follow-up Information    Chyler Creely,MARIE-LYNE, MD Follow up in 3 week(s).   Specialty:  Obstetrics and Gynecology Contact information: [address] [place] [address] [phone]           Signed: Genia DelLAVOIE,MARIE-LYNE, MD 05/25/2016, 10:57 AM

## 2016-05-25 NOTE — Transfer of Care (Signed)
Immediate Anesthesia Transfer of Care Note  Patient: Stacey Ayala  Procedure(s) Performed: Procedure(s): LAPAROSCOPY DIAGNOSTIC/Lysis of Adhesion (N/A) CHROMOPERTUBATION/Methylene Blue Hydrotubation (Bilateral)  Patient Location: PACU  Anesthesia Type:General  Level of Consciousness: awake, alert , oriented and patient cooperative  Airway & Oxygen Therapy: Patient Spontanous Breathing and Patient connected to nasal cannula oxygen  Post-op Assessment: Report given to RN and Post -op Vital signs reviewed and stable  Post vital signs: Reviewed and stable  Last Vitals:  Vitals:   05/25/16 0823  BP: 111/70  Pulse: 83  Resp: 16  Temp: 36.4 C    Last Pain:  Vitals:   05/25/16 0823  PainSc: 3       Patients Stated Pain Goal: 3 (05/25/16 [id number])  Complications: No apparent anesthesia complications

## 2016-05-25 NOTE — Discharge Instructions (Addendum)
DISCHARGE INSTRUCTIONS: Laparoscopy  The following instructions have been prepared to help you care for yourself upon your return home today.  Wound care:  Do not get the incision wet for the first 24 hours. The incision should be kept clean and dry.  The Band-Aids or dressings may be removed the day after surgery.  Should the incision become sore, red, and swollen after the first week, check with your doctor.  Personal hygiene:  Shower the day after your procedure.  Activity and limitations:  Do NOT drive or operate any equipment today.  Do NOT lift anything more than 15 pounds for 2-3 weeks after surgery.  Do NOT rest in bed all day.  Walking is encouraged. Walk each day, starting slowly with 5-minute walks 3 or 4 times a day. Slowly increase the length of your walks.  Walk up and down stairs slowly.  Do NOT do strenuous activities, such as golfing, playing tennis, bowling, running, biking, weight lifting, gardening, mowing, or vacuuming for 2-4 weeks. Ask your doctor when it is okay to start.  Diet: Eat a light meal as desired this evening. You may resume your usual diet tomorrow.  Return to work: This is dependent on the type of work you do. For the most part you can return to a desk job within a week of surgery. If you are more active at work, please discuss this with your doctor.  What to expect after your surgery: You may have a slight burning sensation when you urinate on the first day. You may have a very small amount of blood in the urine. Expect to have a small amount of vaginal discharge/light bleeding for 1-2 weeks. It is not unusual to have abdominal soreness and bruising for up to 2 weeks. You may be tired and need more rest for about 1 week. You may experience shoulder pain for 24-72 hours. Lying flat in bed may relieve it.  Call your doctor for any of the following:  Develop a fever of 100.4 or greater  Inability to urinate 6 hours after discharge from  hospital  Severe pain not relieved by pain medications  Persistent of heavy bleeding at incision site  Redness or swelling around incision site after a week  Increasing nausea or vomiting  Patient Signature________________________________________ Nurse Signature_________________________________________ Diagnostic Laparoscopy A diagnostic laparoscopy is a procedure to diagnose diseases in the abdomen. During the procedure, a thin, lighted, pencil-sized instrument called a laparoscope is inserted into the abdomen through an incision. The laparoscope allows your health care provider to look at the organs inside your body. LET [facility] PROVIDER KNOW ABOUT:  Any allergies you have.  All medicines you are taking, including vitamins, herbs, eye drops, creams, and over-the-counter medicines.  Previous problems you or members of your family have had with the use of anesthetics.  Any blood disorders you have.  Previous surgeries you have had.  Medical conditions you have. RISKS AND COMPLICATIONS  Generally, this is a safe procedure. However, problems can occur, which may include:  Infection.  Bleeding.  Damage to other organs.  Allergic reaction to the anesthetics used during the procedure. BEFORE THE PROCEDURE  Do not eat or drink anything after midnight on the night before the procedure or as directed by your health care provider.  Ask your health care provider about:  Changing or stopping your regular medicines.  Taking medicines such as aspirin and ibuprofen. These medicines can thin your blood. Do not take these medicines before your procedure  if your health care provider instructs you not to.  Plan to have someone take you home after the procedure. PROCEDURE  You may be given a medicine to help you relax (sedative).  You will be given a medicine to make you sleep (general anesthetic).  Your abdomen will be inflated with a gas. This will make your organs  easier to see.  Small incisions will be made in your abdomen.  A laparoscope and other small instruments will be inserted into the abdomen through the incisions.  A tissue sample may be removed from an organ in the abdomen for examination.  The instruments will be removed from the abdomen.  The gas will be released.  The incisions will be closed with stitches (sutures). AFTER THE PROCEDURE  Your blood pressure, heart rate, breathing rate, and blood oxygen level will be monitored often until the medicines you were given have worn off.   This information is not intended to replace advice given to you by your health care provider. Make sure you discuss any questions you have with your health care provider.   Document Released: 12/25/2000 Document Revised: 06/09/2015 Document Reviewed: 05/01/2014 Elsevier Interactive Patient Education [employer]2016 [employer] Inc[employer]

## 2016-05-25 NOTE — Op Note (Signed)
05/25/2016  10:51 AM  PATIENT:  Stacey BolognaBrittany Whittemore  30 y.o. female  PRE-OPERATIVE DIAGNOSIS:  Chronic Left Pelvic Pain, Dysmenorrhea  POST-OPERATIVE DIAGNOSIS:  Chronic Left Pelvic Pain, Dysmenorrhea, Bowel Adhesion  PROCEDURE:  Procedure(s): LAPAROSCOPY Lysis of Adhesion CHROMOPERTUBATION/Methylene Blue Hydrotubation  SURGEON:  Surgeon(s): Genia DelMarie-Lyne Meloney Feld, MD  ASSISTANTS: Arlan Organaniela Paul   ANESTHESIA:   general   PROCEDURE:  Under general anesthesia with endotracheal intubation the patient is an lithotomy position. She is prepped with ChloraPrep on the abdomen and with Betadine on the suprapubic, vulvar and vaginal areas. The Foley is inserted in the bladder.  The patient is draped as usual.  The small Ko-ring with a #8 Rumi are inserted in the uterus without difficulty.  The tenaculum and weighted speculum were removed.  Abdominally we make an infraumbilical incision over 1.5 cm after Marcaine one quarter infiltration.  The aponeurosis is opened with Mayo scissors under direct vision using cokers.  The parietal peritoneum is opened bluntly with a finger. A pursestring stitch of Vicryl 0 was done on the aponeurosis. The Roseanne RenoHassan is inserted at that level under direct vision and a pneumoperitoneum is created with CO2.  The camera is inserted at that level.  The patient is put in deep Trendelenburg.  Inspection of the abdominopelvic cavities reveal an adhesion between the sigmoid colon and the lower abdominal wall on the left side.  5 mm ports are entered on the right and left sides under direct vision after making small incisions with the scalpel.  Marcaine one quarter plain was used at those levels as well.  Thorough inspection of the abdominopelvic cavities reveals no other pathology. Pictures of the liver, the appendix, the uterus, both tubes and both ovaries are taken.  Specifically, no evidence of endometriosis and no other adhesions.  The Methylene blue is pushed in the uterine cavity and we  note Methylene blue coming out of both tubes at the fimbrial ends, confirming bilateral patent tubes.  Pictures were taken after Methylene blue hydrotubation.  The scissors were used to lyse the adhesion between the sigmoid and the left lower abdominal wall.  After lysis of that adhesion the bowels resumed a normal anatomic position.  The pelvic cavity was irrigated and suctioned to remove the Methylene blue.  We confirmed good hemostasis at all levels.  Laparoscopy instruments were removed.  The ports were removed under direct vision. The CO2 was evacuated.  The infraumbilical incision was closed by attaching the pursestring stitch.  We then closed the skin at that level with a subcuticular suture of Vicryl 4-0.  The 5 mm incisions were so small that Dermabond only was used at those levels.  Dermabond was added at the infraumbilical incision.  The instruments were removed from the vagina.  The Foley was removed from the bladder.  The patient was brought to recovery room in good and stable status.  ESTIMATED BLOOD LOSS: 5 cc   Intake/Output Summary (Last 24 hours) at 05/25/16 1051 Last data filed at 05/25/16 1046  Gross per 24 hour  Intake             1000 ml  Output               80 ml  Net              920 ml     BLOOD ADMINISTERED:none   LOCAL MEDICATIONS USED:  MARCAINE     SPECIMEN:  No Specimen  DISPOSITION OF SPECIMEN:  N/A  COUNTS:  YES  PLAN OF CARE: Transfer to PACU   Genia DelMarie-Lyne Yifan Auker MD  05/25/2016 at 10:53 am

## 2016-05-25 NOTE — Anesthesia Preprocedure Evaluation (Signed)
Anesthesia Evaluation  Patient identified by MRN, date of birth, ID band Patient awake    Reviewed: Allergy & Precautions, NPO status , Patient's Chart, lab work & pertinent test results  History of Anesthesia Complications Negative for: history of anesthetic complications  Airway Mallampati: I  TM Distance: >3 FB Neck ROM: Full    Dental no notable dental hx. (+) Teeth Intact, Dental Advisory Given Crown top left:   Pulmonary neg shortness of breath, neg sleep apnea, neg COPD, neg recent URI, Current Smoker,    Pulmonary exam normal breath sounds clear to auscultation       Cardiovascular negative cardio ROS   Rhythm:Regular Rate:Normal     Neuro/Psych  Headaches, neg Seizures Anxiety    GI/Hepatic negative GI ROS, neg GERD  ,(+)     substance abuse (in remission)  , Hepatitis -, C  Endo/Other  negative endocrine ROS  Renal/GU negative Renal ROS     Musculoskeletal   Abdominal (+) - obese,   Peds  Hematology negative hematology ROS (+)   Anesthesia Other Findings   Reproductive/Obstetrics                            Anesthesia Physical Anesthesia Plan  ASA: II  Anesthesia Plan: General   Post-op Pain Management:    Induction: Intravenous  Airway Management Planned: Oral ETT  Additional Equipment:   Intra-op Plan:   Post-operative Plan: Extubation in OR  Informed Consent: I have reviewed the patients History and Physical, chart, labs and discussed the procedure including the risks, benefits and alternatives for the proposed anesthesia with the patient or authorized representative who has indicated his/her understanding and acceptance.   Dental advisory given  Plan Discussed with:   Anesthesia Plan Comments: (Risks of general anesthesia discussed including, but not limited to, sore throat, hoarse voice, chipped/damaged teeth, injury to vocal cords, nausea and vomiting,  allergic reactions, lung infection, heart attack, stroke, and death. All questions answered. )        Anesthesia Quick Evaluation

## 2016-05-25 NOTE — Anesthesia Procedure Notes (Signed)
Procedure Name: Intubation Date/Time: 05/25/2016 9:27 AM Performed by: Suella GroveMOORE, Lottie Sigman C Pre-anesthesia Checklist: Suction available, Emergency Drugs available, Patient being monitored and Timeout performed Patient Re-evaluated:Patient Re-evaluated prior to inductionOxygen Delivery Method: Circle system utilized and Simple face mask Preoxygenation: Pre-oxygenation with 100% oxygen Intubation Type: IV induction and Inhalational induction with existing ETT Ventilation: Mask ventilation without difficulty Laryngoscope Size: Mac and 3 Grade View: Grade II Tube size: 7.0 mm Airway Equipment and Method: Stylet Placement Confirmation: ETT inserted through vocal cords under direct vision,  positive ETCO2 and breath sounds checked- equal and bilateral Secured at: 22 cm Tube secured with: Tape Dental Injury: Teeth and Oropharynx as per pre-operative assessment

## 2016-05-30 ENCOUNTER — Encounter (HOSPITAL_COMMUNITY): Payer: Self-pay | Admitting: Obstetrics & Gynecology

## 2016-06-01 DIAGNOSIS — L292 Pruritus vulvae: Secondary | ICD-10-CM | POA: Diagnosis not present

## 2016-06-07 DIAGNOSIS — K5900 Constipation, unspecified: Secondary | ICD-10-CM | POA: Diagnosis not present

## 2016-06-07 DIAGNOSIS — F419 Anxiety disorder, unspecified: Secondary | ICD-10-CM | POA: Diagnosis not present

## 2016-06-07 DIAGNOSIS — Z79899 Other long term (current) drug therapy: Secondary | ICD-10-CM | POA: Diagnosis not present

## 2016-06-07 DIAGNOSIS — R109 Unspecified abdominal pain: Secondary | ICD-10-CM | POA: Diagnosis not present

## 2016-06-14 DIAGNOSIS — H04123 Dry eye syndrome of bilateral lacrimal glands: Secondary | ICD-10-CM | POA: Diagnosis not present

## 2016-06-21 DIAGNOSIS — F419 Anxiety disorder, unspecified: Secondary | ICD-10-CM | POA: Diagnosis not present

## 2016-06-21 DIAGNOSIS — Z79899 Other long term (current) drug therapy: Secondary | ICD-10-CM | POA: Diagnosis not present

## 2016-06-21 DIAGNOSIS — K5900 Constipation, unspecified: Secondary | ICD-10-CM | POA: Diagnosis not present

## 2016-06-21 DIAGNOSIS — F3289 Other specified depressive episodes: Secondary | ICD-10-CM | POA: Diagnosis not present

## 2016-07-19 DIAGNOSIS — F3289 Other specified depressive episodes: Secondary | ICD-10-CM | POA: Diagnosis not present

## 2016-07-19 DIAGNOSIS — F419 Anxiety disorder, unspecified: Secondary | ICD-10-CM | POA: Diagnosis not present

## 2016-07-19 DIAGNOSIS — Z79899 Other long term (current) drug therapy: Secondary | ICD-10-CM | POA: Diagnosis not present

## 2016-07-19 DIAGNOSIS — Z720 Tobacco use: Secondary | ICD-10-CM | POA: Diagnosis not present

## 2016-07-26 DIAGNOSIS — D225 Melanocytic nevi of trunk: Secondary | ICD-10-CM | POA: Diagnosis not present

## 2016-08-08 DIAGNOSIS — D225 Melanocytic nevi of trunk: Secondary | ICD-10-CM | POA: Diagnosis not present

## 2016-08-08 DIAGNOSIS — B078 Other viral warts: Secondary | ICD-10-CM | POA: Diagnosis not present

## 2016-08-08 DIAGNOSIS — D485 Neoplasm of uncertain behavior of skin: Secondary | ICD-10-CM | POA: Diagnosis not present

## 2016-08-08 DIAGNOSIS — D2239 Melanocytic nevi of other parts of face: Secondary | ICD-10-CM | POA: Diagnosis not present

## 2016-08-08 DIAGNOSIS — L905 Scar conditions and fibrosis of skin: Secondary | ICD-10-CM | POA: Diagnosis not present

## 2016-08-08 DIAGNOSIS — L821 Other seborrheic keratosis: Secondary | ICD-10-CM | POA: Diagnosis not present

## 2016-08-14 DIAGNOSIS — Z79899 Other long term (current) drug therapy: Secondary | ICD-10-CM | POA: Diagnosis not present

## 2016-08-14 DIAGNOSIS — F419 Anxiety disorder, unspecified: Secondary | ICD-10-CM | POA: Diagnosis not present

## 2016-08-14 DIAGNOSIS — E669 Obesity, unspecified: Secondary | ICD-10-CM | POA: Diagnosis not present

## 2016-08-14 DIAGNOSIS — F3289 Other specified depressive episodes: Secondary | ICD-10-CM | POA: Diagnosis not present

## 2016-09-11 DIAGNOSIS — Z79899 Other long term (current) drug therapy: Secondary | ICD-10-CM | POA: Diagnosis not present

## 2016-09-11 DIAGNOSIS — Z720 Tobacco use: Secondary | ICD-10-CM | POA: Diagnosis not present

## 2016-09-11 DIAGNOSIS — F419 Anxiety disorder, unspecified: Secondary | ICD-10-CM | POA: Diagnosis not present

## 2016-09-11 DIAGNOSIS — L700 Acne vulgaris: Secondary | ICD-10-CM | POA: Diagnosis not present

## 2016-09-15 DIAGNOSIS — R002 Palpitations: Secondary | ICD-10-CM | POA: Diagnosis not present

## 2016-09-15 DIAGNOSIS — F3289 Other specified depressive episodes: Secondary | ICD-10-CM | POA: Diagnosis not present

## 2016-09-15 DIAGNOSIS — F419 Anxiety disorder, unspecified: Secondary | ICD-10-CM | POA: Diagnosis not present

## 2016-09-15 DIAGNOSIS — R0602 Shortness of breath: Secondary | ICD-10-CM | POA: Diagnosis not present

## 2016-10-09 DIAGNOSIS — R002 Palpitations: Secondary | ICD-10-CM | POA: Diagnosis not present

## 2016-10-09 DIAGNOSIS — F419 Anxiety disorder, unspecified: Secondary | ICD-10-CM | POA: Diagnosis not present

## 2016-10-09 DIAGNOSIS — R5383 Other fatigue: Secondary | ICD-10-CM | POA: Diagnosis not present

## 2016-10-09 DIAGNOSIS — F3289 Other specified depressive episodes: Secondary | ICD-10-CM | POA: Diagnosis not present

## 2016-10-16 DIAGNOSIS — Z79899 Other long term (current) drug therapy: Secondary | ICD-10-CM | POA: Diagnosis not present

## 2016-10-16 DIAGNOSIS — F3289 Other specified depressive episodes: Secondary | ICD-10-CM | POA: Diagnosis not present

## 2016-10-16 DIAGNOSIS — F419 Anxiety disorder, unspecified: Secondary | ICD-10-CM | POA: Diagnosis not present

## 2016-10-16 DIAGNOSIS — M79609 Pain in unspecified limb: Secondary | ICD-10-CM | POA: Diagnosis not present

## 2016-10-16 DIAGNOSIS — R002 Palpitations: Secondary | ICD-10-CM | POA: Diagnosis not present

## 2016-10-16 DIAGNOSIS — R5383 Other fatigue: Secondary | ICD-10-CM | POA: Diagnosis not present

## 2016-11-20 DIAGNOSIS — Z79899 Other long term (current) drug therapy: Secondary | ICD-10-CM | POA: Diagnosis not present

## 2016-11-20 DIAGNOSIS — F419 Anxiety disorder, unspecified: Secondary | ICD-10-CM | POA: Diagnosis not present

## 2016-11-20 DIAGNOSIS — F410 Panic disorder [episodic paroxysmal anxiety] without agoraphobia: Secondary | ICD-10-CM | POA: Diagnosis not present

## 2016-11-30 DIAGNOSIS — Z1151 Encounter for screening for human papillomavirus (HPV): Secondary | ICD-10-CM | POA: Diagnosis not present

## 2016-11-30 DIAGNOSIS — Z6823 Body mass index (BMI) 23.0-23.9, adult: Secondary | ICD-10-CM | POA: Diagnosis not present

## 2016-11-30 DIAGNOSIS — Z01419 Encounter for gynecological examination (general) (routine) without abnormal findings: Secondary | ICD-10-CM | POA: Diagnosis not present

## 2016-12-18 DIAGNOSIS — F419 Anxiety disorder, unspecified: Secondary | ICD-10-CM | POA: Diagnosis not present

## 2016-12-18 DIAGNOSIS — F410 Panic disorder [episodic paroxysmal anxiety] without agoraphobia: Secondary | ICD-10-CM | POA: Diagnosis not present

## 2016-12-18 DIAGNOSIS — Z79899 Other long term (current) drug therapy: Secondary | ICD-10-CM | POA: Diagnosis not present

## 2017-01-02 DIAGNOSIS — F419 Anxiety disorder, unspecified: Secondary | ICD-10-CM | POA: Diagnosis not present

## 2017-01-02 DIAGNOSIS — M545 Low back pain: Secondary | ICD-10-CM | POA: Diagnosis not present

## 2017-01-02 DIAGNOSIS — Z79899 Other long term (current) drug therapy: Secondary | ICD-10-CM | POA: Diagnosis not present

## 2017-01-02 DIAGNOSIS — F410 Panic disorder [episodic paroxysmal anxiety] without agoraphobia: Secondary | ICD-10-CM | POA: Diagnosis not present

## 2017-01-25 DIAGNOSIS — J069 Acute upper respiratory infection, unspecified: Secondary | ICD-10-CM | POA: Diagnosis not present

## 2017-01-25 DIAGNOSIS — R0789 Other chest pain: Secondary | ICD-10-CM | POA: Diagnosis not present

## 2017-01-25 DIAGNOSIS — R05 Cough: Secondary | ICD-10-CM | POA: Diagnosis not present

## 2017-01-26 DIAGNOSIS — J40 Bronchitis, not specified as acute or chronic: Secondary | ICD-10-CM | POA: Diagnosis not present

## 2017-01-26 DIAGNOSIS — F17200 Nicotine dependence, unspecified, uncomplicated: Secondary | ICD-10-CM | POA: Diagnosis not present

## 2017-01-30 DIAGNOSIS — J209 Acute bronchitis, unspecified: Secondary | ICD-10-CM | POA: Diagnosis not present

## 2017-01-30 DIAGNOSIS — F410 Panic disorder [episodic paroxysmal anxiety] without agoraphobia: Secondary | ICD-10-CM | POA: Diagnosis not present

## 2017-02-13 DIAGNOSIS — K047 Periapical abscess without sinus: Secondary | ICD-10-CM | POA: Diagnosis not present

## 2017-02-27 DIAGNOSIS — F410 Panic disorder [episodic paroxysmal anxiety] without agoraphobia: Secondary | ICD-10-CM | POA: Diagnosis not present

## 2017-03-22 DIAGNOSIS — F410 Panic disorder [episodic paroxysmal anxiety] without agoraphobia: Secondary | ICD-10-CM | POA: Diagnosis not present

## 2017-06-12 ENCOUNTER — Ambulatory Visit (INDEPENDENT_AMBULATORY_CARE_PROVIDER_SITE_OTHER): Payer: BLUE CROSS/BLUE SHIELD | Admitting: Obstetrics & Gynecology

## 2017-06-12 ENCOUNTER — Encounter: Payer: Self-pay | Admitting: Obstetrics & Gynecology

## 2017-06-12 VITALS — BP 124/76 | Ht 69.0 in | Wt 160.0 lb

## 2017-06-12 DIAGNOSIS — Z3169 Encounter for other general counseling and advice on procreation: Secondary | ICD-10-CM

## 2017-06-12 DIAGNOSIS — Z1151 Encounter for screening for human papillomavirus (HPV): Secondary | ICD-10-CM | POA: Diagnosis not present

## 2017-06-12 DIAGNOSIS — Z01419 Encounter for gynecological examination (general) (routine) without abnormal findings: Secondary | ICD-10-CM | POA: Diagnosis not present

## 2017-06-12 NOTE — Progress Notes (Signed)
Stacey Ayala 08/02/1986 [id number]   History:    31 y.o.  G0 Married  RP:  Established patient presenting for annual gyn exam  HPI: Will start attempting conception actively next month.  On PNVs.  D/Ced cigarette smoking.  Exercising.  Weaning Clonazepam for anxiety.  Menses reg every 21-24 days.  Ovulating per urine LH tests.  Dx LPS with me 05/2016 for Left Pelvic pain/Dysmeno.  Had minor bowel adhesions lysed.  No pelvic pain currently. Breasts wnl.  Mictions/BMs wnl.  Past medical history,surgical history, family history and social history were all reviewed and documented in the EPIC chart.  Gynecologic History Patient's last menstrual period was 06/08/2017. Contraception: none Last Pap: 2017. Results were: normal Last mammogram: Never.   Obstetric History OB History  Gravida Para Term Preterm AB Living  0 0 0 0 0 0  SAB TAB Ectopic Multiple Live Births  0 0 0 0           ROS: A ROS was performed and pertinent positives and negatives are included in the history.  GENERAL: No fevers or chills. HEENT: No change in vision, no earache, sore throat or sinus congestion. NECK: No pain or stiffness. CARDIOVASCULAR: No chest pain or pressure. No palpitations. PULMONARY: No shortness of breath, cough or wheeze. GASTROINTESTINAL: No abdominal pain, nausea, vomiting or diarrhea, melena or bright red blood per rectum. GENITOURINARY: No urinary frequency, urgency, hesitancy or dysuria. MUSCULOSKELETAL: No joint or muscle pain, no back pain, no recent trauma. DERMATOLOGIC: No rash, no itching, no lesions. ENDOCRINE: No polyuria, polydipsia, no heat or cold intolerance. No recent change in weight. HEMATOLOGICAL: No anemia or easy bruising or bleeding. NEUROLOGIC: No headache, seizures, numbness, tingling or weakness. PSYCHIATRIC: No depression, no loss of interest in normal activity or change in sleep pattern.     Exam:   BP 124/76   Ht 5\' 9"  (1.753 m)   Wt 160 lb (72.6 kg)   LMP  06/08/2017   BMI 23.63 kg/m   Body mass index is 23.63 kg/m.  General appearance : Well developed well nourished female. No acute distress HEENT: Eyes: no retinal hemorrhage or exudates,  Neck supple, trachea midline, no carotid bruits, no thyroidmegaly Lungs: Clear to auscultation, no rhonchi or wheezes, or rib retractions  Heart: Regular rate and rhythm, no murmurs or gallops Breast:Examined in sitting and supine position were symmetrical in appearance, no palpable masses or tenderness,  no skin retraction, no nipple inversion, no nipple discharge, no skin discoloration, no axillary or supraclavicular lymphadenopathy Abdomen: no palpable masses or tenderness, no rebound or guarding Extremities: no edema or skin discoloration or tenderness  Pelvic: Vulva normal  Bartholin, Urethra, Skene Glands: Within normal limits             Vagina: No gross lesions or discharge  Cervix: No gross lesions or discharge.  Pap/HPV done.  Uterus  AV, normal size, shape and consistency, non-tender and mobile  Adnexa  Without masses or tenderness  Anus and perineum  normal     Assessment/Plan:  31 y.o. female for annual exam   1. Encounter for routine gynecological examination with Papanicolaou smear of cervix Normal gyn exam.  Pap/HPV done.  Breasts wnl. - PAP,TP IMGw/HPV RNA,rflx HPVTYPE16,18/45  2. Special screening examination for human papillomavirus (HPV)  - PAP,TP IMGw/HPV RNA,rflx HPVTYPE16,18/45  3. Encounter for preconception consultation Ovulatory cycles.  On PNVs.  D/Ced cigarette smoking.  Exercising/good nutrition.  Weaning Clonazepam for anxiety.  Counseling on above issues >50%  x 10 minutes.  Genia Del MD, 4:15 PM 06/12/2017

## 2017-06-13 NOTE — Patient Instructions (Signed)
1. Encounter for routine gynecological examination with Papanicolaou smear of cervix Normal gyn exam.  Pap/HPV done.  Breasts wnl. - PAP,TP IMGw/HPV RNA,rflx HPVTYPE16,18/45  2. Special screening examination for human papillomavirus (HPV)  - PAP,TP IMGw/HPV RNA,rflx HPVTYPE16,18/45  3. Encounter for preconception consultation Ovulatory cycles.  On PNVs.  D/Ced cigarette smoking.  Exercising/good nutrition.  Weaning Clonazepam for anxiety.  Stacey Ayala, it was a pleasure to see you today!  I will inform you of your results as soon as available.   Eating Plan for Pregnant Women While you are pregnant, your body will require additional nutrition to help support your growing baby. It is recommended that you consume:  150 additional calories each day during your first trimester.  300 additional calories each day during your second trimester.  300 additional calories each day during your third trimester.  Eating a healthy, well-balanced diet is very important for your health and for your baby's health. You also have a higher need for some vitamins and minerals, such as folic acid, calcium, iron, and vitamin D. What do I need to know about eating during pregnancy?  Do not try to lose weight or go on a diet during pregnancy.  Choose healthy, nutritious foods. Choose  of a sandwich with a glass of milk instead of a candy bar or a high-calorie sugar-sweetened beverage.  Limit your overall intake of foods that have "empty calories." These are foods that have little nutritional value, such as sweets, desserts, candies, sugar-sweetened beverages, and fried foods.  Eat a variety of foods, especially fruits and vegetables.  Take a prenatal vitamin to help meet the additional needs during pregnancy, specifically for folic acid, iron, calcium, and vitamin D.  Remember to stay active. Ask your health care provider for exercise recommendations that are specific to you.  Practice good food safety and  cleanliness, such as washing your hands before you eat and after you prepare raw meat. This helps to prevent foodborne illnesses, such as listeriosis, that can be very dangerous for your baby. Ask your health care provider for more information about listeriosis. What does 150 extra calories look like? Healthy options for an additional 150 calories each day could be any of the following:  Plain low-fat yogurt (6-8 oz) with  cup of berries.  1 apple with 2 teaspoons of peanut butter.  Cut-up vegetables with  cup of hummus.  Low-fat chocolate milk (8 oz or 1 cup).  1 string cheese with 1 medium orange.   of a peanut butter and jelly sandwich on whole-wheat bread (1 tsp of peanut butter).  For 300 calories, you could eat two of those healthy options each day. What is a healthy amount of weight to gain? The recommended amount of weight for you to gain is based on your pre-pregnancy BMI. If your pre-pregnancy BMI was:  Less than 18 (underweight), you should gain 28-40 lb.  18-24.9 (normal), you should gain 25-35 lb.  25-29.9 (overweight), you should gain 15-25 lb.  Greater than 30 (obese), you should gain 11-20 lb.  What if I am having twins or multiples? Generally, pregnant women who will be having twins or multiples may need to increase their daily calories by 300-600 calories each day. The recommended range for total weight gain is 25-54 lb, depending on your pre-pregnancy BMI. Talk with your health care provider for specific guidance about additional nutritional needs, weight gain, and exercise during your pregnancy. What foods can I eat? Grains Any grains. Try to choose whole grains,  such as whole-wheat bread, oatmeal, or brown rice. Vegetables Any vegetables. Try to eat a variety of colors and types of vegetables to get a full range of vitamins and minerals. Remember to wash your vegetables well before eating. Fruits Any fruits. Try to eat a variety of colors and types of fruit  to get a full range of vitamins and minerals. Remember to wash your fruits well before eating. Meats and Other Protein Sources Lean meats, including chicken, [place], fish, and lean cuts of beef, veal, or pork. Make sure that all meats are cooked to "well done." Tofu. Tempeh. Beans. Eggs. Peanut butter and other nut butters. Seafood, such as shrimp, crab, and lobster. If you choose fish, select types that are higher in omega-3 fatty acids, including salmon, herring, mussels, trout, sardines, and pollock. Make sure that all meats are cooked to food-safe temperatures. Dairy Pasteurized milk and milk alternatives. Pasteurized yogurt and pasteurized cheese. Cottage cheese. Sour cream. Beverages Water. Juices that contain 100% fruit juice or vegetable juice. Caffeine-free teas and decaffeinated coffee. Drinks that contain caffeine are okay to drink, but it is better to avoid caffeine. Keep your total caffeine intake to less than 200 mg each day (12 oz of coffee, tea, or soda) or as directed by your health care provider. Condiments Any pasteurized condiments. Sweets and Desserts Any sweets and desserts. Fats and Oils Any fats and oils. The items listed above may not be a complete list of recommended foods or beverages. Contact your dietitian for more options. What foods are not recommended? Vegetables Unpasteurized (raw) vegetable juices. Fruits Unpasteurized (raw) fruit juices. Meats and Other Protein Sources Cured meats that have nitrates, such as bacon, salami, and hotdogs. Luncheon meats, bologna, or other deli meats (unless they are reheated until they are steaming hot). Refrigerated pate, meat spreads from a meat counter, smoked seafood that is found in the refrigerated section of a store. Raw fish, such as sushi or sashimi. High mercury content fish, such as tilefish, shark, swordfish, and king mackerel. Raw meats, such as tuna or beef tartare. Undercooked meats and poultry. Make sure that all  meats are cooked to food-safe temperatures. Dairy Unpasteurized (raw) milk and any foods that have raw milk in them. Soft cheeses, such as feta, queso blanco, queso fresco, Brie, Camembert cheeses, blue-veined cheeses, and Panela cheese (unless it is made with pasteurized milk, which must be stated on the label). Beverages Alcohol. Sugar-sweetened beverages, such as sodas, teas, or energy drinks. Condiments Homemade fermented foods and drinks, such as pickles, sauerkraut, or kombucha drinks. (Store-bought pasteurized versions of these are okay.) Other Salads that are made in the store, such as ham salad, chicken salad, egg salad, tuna salad, and seafood salad. The items listed above may not be a complete list of foods and beverages to avoid. Contact your dietitian for more information. This information is not intended to replace advice given to you by your health care provider. Make sure you discuss any questions you have with your health care provider. Document Released: 07/03/2014 Document Revised: 02/24/2016 Document Reviewed: 03/03/2014 Elsevier Interactive Patient Education  [employer].

## 2017-06-14 LAB — PAP, TP IMAGING W/ HPV RNA, RFLX HPV TYPE 16,18/45: HPV DNA High Risk: NOT DETECTED

## 2017-06-19 ENCOUNTER — Ambulatory Visit (INDEPENDENT_AMBULATORY_CARE_PROVIDER_SITE_OTHER): Payer: BLUE CROSS/BLUE SHIELD | Admitting: Obstetrics & Gynecology

## 2017-06-19 ENCOUNTER — Encounter: Payer: Self-pay | Admitting: Obstetrics & Gynecology

## 2017-06-19 VITALS — BP 122/78

## 2017-06-19 DIAGNOSIS — R87615 Unsatisfactory cytologic smear of cervix: Secondary | ICD-10-CM | POA: Diagnosis not present

## 2017-06-19 NOTE — Patient Instructions (Signed)
1. Unsatisfactory cervical Papanicolaou smear Absence of TZ cells.  HPV HR neg.  Pap repeated today.  [name], good to see you today!  I will inform you of your results as soon as available.

## 2017-06-19 NOTE — Progress Notes (Signed)
    Stacey Ayala Aug 19, 1986 [id number]        31 y.o.  G0  RP:  Absence of TZ cells, repeat Pap  HPI:  Pap 06/12/2017 Satisfactory/normal, but absence of TZ cells.  HPV HR neg.  Past medical history,surgical history, problem list, medications, allergies, family history and social history were all reviewed and documented in the EPIC chart.  Directed ROS with pertinent positives and negatives documented in the history of present illness/assessment and plan.  Exam:  Vitals:   06/19/17 1357  BP: 122/78   General appearance:  Normal  Gyn exam:  Vulva normal.  Speculum:  Cervix/Vagina normal.  Pap done.   Assessment/Plan:  31 y.o. G0  1. Unsatisfactory cervical Papanicolaou smear Absence of TZ cells.  HPV HR neg.  Pap repeated today.  Genia Del MD, 2:28 PM 06/19/2017

## 2017-06-19 NOTE — Addendum Note (Signed)
Addended by: Berna Spare A on: 06/19/2017 03:03 PM   Modules accepted: Orders

## 2017-06-20 DIAGNOSIS — F410 Panic disorder [episodic paroxysmal anxiety] without agoraphobia: Secondary | ICD-10-CM | POA: Diagnosis not present

## 2017-06-20 LAB — PAP IG W/ RFLX HPV ASCU

## 2017-06-22 ENCOUNTER — Telehealth: Payer: Self-pay

## 2017-06-22 NOTE — Telephone Encounter (Signed)
I called patient and informed her she needed to return for re-pap. This was a repeat pap from the original pap smear done at her 06/12/17 annual exam.   She was concerned that for a second time you were unable to get transformation zone cells. She said at visit you explained this to her. She said she doesn't mind coming back but she is in a tight place financially and the co-payments are difficult right now.  She wanted to see if you thought anything was wrong with her because unable to get those cells?  And what you recommended?

## 2017-06-22 NOTE — Telephone Encounter (Signed)
-----   Message from Genia Del, MD sent at 06/22/2017  8:49 AM EDT ----- Pap negative, but TZ absent.  Recommend repeating Pap.

## 2017-06-24 NOTE — Telephone Encounter (Signed)
Really, I'm surprised because I was very insistent to get those cells.  Will use an Os Finder to improve access to her TZ which is probably deeper in the canal.  Definitely nothing wrong with her.  Will not charge her, but pathologist will.  At this point, with no abnormal cell and a satisfactory pap, I feel very comfortable waiting to repeat the pap next year.  Up to her.

## 2017-06-25 NOTE — Telephone Encounter (Signed)
Patient informed. Patient wants to wait and repeat next year since Dr. Seymour Bars is comfortable with her waiting.

## 2017-07-17 ENCOUNTER — Emergency Department (HOSPITAL_COMMUNITY)
Admission: EM | Admit: 2017-07-17 | Discharge: 2017-07-17 | Discharge status: Against Medical Advice | Payer: BLUE CROSS/BLUE SHIELD

## 2017-07-17 NOTE — ED Notes (Signed)
Called patient x 3 and no answer. 

## 2017-07-17 NOTE — ED Notes (Signed)
Called patient and no answer.

## 2017-07-17 NOTE — ED Notes (Signed)
Patient called and no answer. 

## 2017-07-18 ENCOUNTER — Emergency Department (HOSPITAL_COMMUNITY)
Admission: EM | Admit: 2017-07-18 | Discharge: 2017-07-18 | Disposition: A | Discharge status: Home / Self Care | Payer: BLUE CROSS/BLUE SHIELD | Attending: Emergency Medicine | Admitting: Emergency Medicine

## 2017-07-18 ENCOUNTER — Encounter (HOSPITAL_COMMUNITY): Payer: Self-pay

## 2017-07-18 ENCOUNTER — Emergency Department (HOSPITAL_COMMUNITY): Payer: BLUE CROSS/BLUE SHIELD

## 2017-07-18 DIAGNOSIS — R519 Headache, unspecified: Secondary | ICD-10-CM

## 2017-07-18 DIAGNOSIS — Z87891 Personal history of nicotine dependence: Secondary | ICD-10-CM | POA: Insufficient documentation

## 2017-07-18 DIAGNOSIS — Z79899 Other long term (current) drug therapy: Secondary | ICD-10-CM | POA: Insufficient documentation

## 2017-07-18 DIAGNOSIS — R51 Headache: Secondary | ICD-10-CM | POA: Diagnosis not present

## 2017-07-18 DIAGNOSIS — H538 Other visual disturbances: Secondary | ICD-10-CM | POA: Diagnosis present

## 2017-07-18 LAB — CBC WITH DIFFERENTIAL/PLATELET
Basophils Absolute: 0 10*3/uL (ref 0.0–0.1)
Basophils Relative: 0 %
EOS ABS: 0.3 10*3/uL (ref 0.0–0.7)
Eosinophils Relative: 3 %
HCT: 40.3 % (ref 36.0–46.0)
HEMOGLOBIN: 13.8 g/dL (ref 12.0–15.0)
LYMPHS ABS: 3.1 10*3/uL (ref 0.7–4.0)
LYMPHS PCT: 31 %
MCH: 30.8 pg (ref 26.0–34.0)
MCHC: 34.2 g/dL (ref 30.0–36.0)
MCV: 90 fL (ref 78.0–100.0)
MONOS PCT: 5 %
Monocytes Absolute: 0.5 10*3/uL (ref 0.1–1.0)
Neutro Abs: 6 10*3/uL (ref 1.7–7.7)
Neutrophils Relative %: 61 %
Platelets: 218 10*3/uL (ref 150–400)
RBC: 4.48 MIL/uL (ref 3.87–5.11)
RDW: 14.1 % (ref 11.5–15.5)
WBC: 9.9 10*3/uL (ref 4.0–10.5)

## 2017-07-18 LAB — RAPID URINE DRUG SCREEN, HOSP PERFORMED
Amphetamines: NOT DETECTED
BARBITURATES: NOT DETECTED
BENZODIAZEPINES: NOT DETECTED
Cocaine: NOT DETECTED
Opiates: NOT DETECTED
Tetrahydrocannabinol: NOT DETECTED

## 2017-07-18 LAB — COMPREHENSIVE METABOLIC PANEL
ALK PHOS: 41 U/L (ref 38–126)
ALT: 8 U/L — ABNORMAL LOW (ref 14–54)
ANION GAP: 9 (ref 5–15)
AST: 14 U/L — ABNORMAL LOW (ref 15–41)
Albumin: 4.3 g/dL (ref 3.5–5.0)
BILIRUBIN TOTAL: 0.5 mg/dL (ref 0.3–1.2)
BUN: 9 mg/dL (ref 6–20)
CALCIUM: 9.2 mg/dL (ref 8.9–10.3)
CO2: 23 mmol/L (ref 22–32)
CREATININE: 0.87 mg/dL (ref 0.44–1.00)
Chloride: 105 mmol/L (ref 101–111)
Glucose, Bld: 75 mg/dL (ref 65–99)
Potassium: 4.5 mmol/L (ref 3.5–5.1)
SODIUM: 137 mmol/L (ref 135–145)
TOTAL PROTEIN: 7 g/dL (ref 6.5–8.1)

## 2017-07-18 LAB — ETHANOL: Alcohol, Ethyl (B): <10 mg/dL (ref <10)

## 2017-07-18 LAB — PREGNANCY, URINE: Preg Test, Ur: NEGATIVE

## 2017-07-18 MED ORDER — KETOROLAC TROMETHAMINE 30 MG/ML IJ SOLN
30.0000 mg | Freq: Once | INTRAMUSCULAR | Status: DC
  Filled 2017-07-18: qty 1

## 2017-07-18 MED ORDER — DIPHENHYDRAMINE HCL 50 MG/ML IJ SOLN
25.0000 mg | Freq: Once | INTRAMUSCULAR | Status: DC
  Filled 2017-07-18: qty 1

## 2017-07-18 MED ORDER — HYDROMORPHONE HCL 1 MG/ML IJ SOLN: 1.0000 mg | Freq: Once | INTRAMUSCULAR | Status: DC

## 2017-07-18 MED ORDER — DIPHENHYDRAMINE HCL 50 MG/ML IJ SOLN
25.0000 mg | Freq: Once | INTRAMUSCULAR | Status: AC
  Administered 2017-07-18: 25 mg via INTRAMUSCULAR
  Filled 2017-07-18: qty 1

## 2017-07-18 MED ORDER — HYDROMORPHONE HCL 1 MG/ML IJ SOLN
0.5000 mg | Freq: Once | INTRAMUSCULAR | Status: AC
  Administered 2017-07-18: 0.5 mg via INTRAMUSCULAR
  Filled 2017-07-18: qty 1

## 2017-07-18 MED ORDER — CYCLOBENZAPRINE HCL 10 MG PO TABS: 10.0000 mg | ORAL_TABLET | Freq: Two times a day (BID) | ORAL | 0 refills | Status: DC | PRN

## 2017-07-18 MED ORDER — ONDANSETRON 8 MG PO TBDP
8.0000 mg | ORAL_TABLET | Freq: Once | ORAL | Status: AC
  Administered 2017-07-18: 8 mg via ORAL
  Filled 2017-07-18: qty 1

## 2017-07-18 MED ORDER — METOCLOPRAMIDE HCL 10 MG PO TABS: 10.0000 mg | ORAL_TABLET | Freq: Three times a day (TID) | ORAL | 0 refills | Status: DC | PRN

## 2017-07-18 MED ORDER — DEXAMETHASONE 4 MG PO TABS
10.0000 mg | ORAL_TABLET | Freq: Once | ORAL | Status: AC
  Administered 2017-07-18: 10 mg via ORAL
  Filled 2017-07-18: qty 2

## 2017-07-18 MED ORDER — SODIUM CHLORIDE 0.9 % IV BOLUS (SEPSIS): 1000.0000 mL | Freq: Once | INTRAVENOUS | Status: DC

## 2017-07-18 MED ORDER — METOCLOPRAMIDE HCL 5 MG/ML IJ SOLN
10.0000 mg | Freq: Once | INTRAMUSCULAR | Status: DC
  Filled 2017-07-18: qty 2

## 2017-07-18 MED ORDER — IBUPROFEN 200 MG PO TABS
600.0000 mg | ORAL_TABLET | Freq: Once | ORAL | Status: AC
  Administered 2017-07-18: 600 mg via ORAL
  Filled 2017-07-18: qty 3

## 2017-07-18 MED ORDER — METOCLOPRAMIDE HCL 10 MG PO TABS
10.0000 mg | ORAL_TABLET | Freq: Once | ORAL | Status: AC
  Administered 2017-07-18: 10 mg via ORAL
  Filled 2017-07-18: qty 1

## 2017-07-18 MED ORDER — PROCHLORPERAZINE EDISYLATE 5 MG/ML IJ SOLN
10.0000 mg | Freq: Once | INTRAMUSCULAR | Status: DC
  Filled 2017-07-18: qty 2

## 2017-07-18 MED ORDER — HYDROMORPHONE HCL 1 MG/ML IJ SOLN
1.0000 mg | Freq: Once | INTRAMUSCULAR | Status: AC
  Administered 2017-07-18: 1 mg via INTRAMUSCULAR
  Filled 2017-07-18: qty 1

## 2017-07-18 NOTE — ED Notes (Signed)
ED Provider at bedside. 

## 2017-07-18 NOTE — ED Notes (Signed)
Patient reports she has ride home.

## 2017-07-18 NOTE — ED Provider Notes (Signed)
[facility]-EMERGENCY DEPT Provider Note   CSN: [id number] Arrival date & time: 07/18/17  1311     History   Chief Complaint Chief Complaint  Patient presents with  . Numbness  . Headache  . Diarrhea  . Weakness    HPI Stacey Ayala is a 31 y.o. female.  Patient presents with blurred vision yesterday afternoon between 4 and 6 PM that lasted approximately 45 minutes. The symptoms were associated with a right temporal headache and numbness on the left side of her body. She claims similar symptoms 15 years ago which was diagnosed as a "panic attack". She takes Klonopin up to 1 mg 3 times a day for anxiety. She has a history of polysubstance abuse which is in remission for the past several years. Review of systems positive for nausea and diarrhea. She is now neurologically back to normal.  No history of hypertension, diabetes, vascular disease. She is a Interior and spatial designer by occupation.         Past Medical History:  Diagnosis Date  . Anxiety   . Headache    Migraines  . History of hepatitis    Hep C  . Substance abuse in remission (HCC)   . Voiding difficulty    push to empty bladder    There are no active problems to display for this patient.   Past Surgical History:  Procedure Laterality Date  . CHROMOPERTUBATION Bilateral 05/25/2016   Procedure: CHROMOPERTUBATION/Methylene Blue Hydrotubation;  Surgeon: Genia Del, MD;  Location: [facility] ORS;  Service: Gynecology;  Laterality: Bilateral;  . LAPAROSCOPY N/A 05/25/2016   Procedure: LAPAROSCOPY DIAGNOSTIC/Lysis of Adhesion;  Surgeon: Genia Del, MD;  Location: [facility];  Service: Gynecology;  Laterality: N/A;    OB History    Gravida Para Term Preterm AB Living   0 0 0 0 0 0   SAB TAB Ectopic Multiple Live Births   0 0 0 0         Home Medications    Prior to Admission medications   Medication Sig Start Date End Date Taking? Authorizing Provider  clonazePAM (KLONOPIN) 1 MG tablet Take  0.5-1 mg by mouth 3 (three) times daily.    Yes [provider]  ibuprofen (ADVIL,MOTRIN) 200 MG tablet Take 800 mg by mouth every 6 (six) hours as needed for fever, headache, mild pain, moderate pain or cramping.   Yes [provider]  ibuprofen (ADVIL,MOTRIN) 600 MG tablet Take 600 mg by mouth every 6 (six) hours as needed for fever, headache, mild pain, moderate pain or cramping.   Yes [provider]  Prenatal Vit-Fe Fumarate-FA (PRENATAL MULTIVITAMIN) TABS tablet Take 1 tablet by mouth daily.   Yes [provider]    Family History Family History  Problem Relation Age of Onset  . Diabetes Mother   . Hypertension Mother   . Breast cancer Paternal Grandmother   . Cancer Paternal Grandmother        melanoma  . Hypertension Paternal Grandmother     Social History Social History  Substance Use Topics  . Smoking status: Former Smoker    Packs/day: 0.50    Types: Cigarettes    Quit date: 06/09/2017  . Smokeless tobacco: Never Used  . Alcohol use No     Allergies   Patient has no known allergies.   Review of Systems Review of Systems  All other systems reviewed and are negative.    Physical Exam Updated Vital Signs BP 138/84 (BP Location: Right Arm)  Pulse 77   Temp 98 F (36.7 C) (Oral)   Resp 16   Ht 5\' 9"  (1.753 m)   Wt 70.3 kg (155 lb)   LMP 07/08/2017   SpO2 100%   BMI 22.89 kg/m   Physical Exam  Constitutional: She is oriented to person, place, and time. She appears well-developed and well-nourished.  HENT:  Head: Normocephalic and atraumatic.  Eyes: Conjunctivae are normal.  Neck: Neck supple.  Cardiovascular: Normal rate and regular rhythm.   Pulmonary/Chest: Effort normal and breath sounds normal.  Abdominal: Soft. Bowel sounds are normal.  Musculoskeletal: Normal range of motion.  Neurological: She is alert and oriented to person, place, and time.  Skin: Skin is warm and dry.  Psychiatric: She has a normal mood  and affect. Her behavior is normal.  Nursing note and vitals reviewed.    ED Treatments / Results  Labs (all labs ordered are listed, but only abnormal results are displayed) Labs Reviewed  RAPID URINE DRUG SCREEN, HOSP PERFORMED  CBC WITH DIFFERENTIAL/PLATELET  COMPREHENSIVE METABOLIC PANEL  ETHANOL    EKG  EKG Interpretation None       Radiology Ct Head Wo Contrast  Result Date: 07/18/2017 CLINICAL DATA:  Blurred vision 1 day prior. Left-sided numbness and right-sided headache. Nausea. EXAM: CT HEAD WITHOUT CONTRAST TECHNIQUE: Contiguous axial images were obtained from the base of the skull through the vertex without intravenous contrast. COMPARISON:  None. FINDINGS: Brain: The ventricles are normal in size and configuration. There is no intracranial mass, hemorrhage, extra-axial fluid collection, or midline shift. Gray-white compartments appear normal. No acute infarct evident. Vascular: There is no demonstrable hyperdense vessel. No vascular calcifications are appreciable. Skull: Bony calvarium appears intact. Sinuses/Orbits: Visualized paranasal sinuses are clear. Orbits appear symmetric bilaterally. Other: Mastoid air cells are clear. IMPRESSION: Study within normal limits. Electronically Signed   By: Bretta BangWilliam  Woodruff III M.D.   On: 07/18/2017 14:40    Procedures Procedures (including critical care time)  Medications Ordered in ED Medications  ondansetron (ZOFRAN-ODT) disintegrating tablet 8 mg (not administered)  HYDROmorphone (DILAUDID) injection 1 mg (not administered)     Initial Impression / Assessment and Plan / ED Course  I have reviewed the triage vital signs and the nursing notes.  Pertinent labs & imaging results that were available during my care of the patient were reviewed by me and considered in my medical decision making (see chart for details).     Patient is a completely normal neurological exam at this time. Will order Dilaudid 1 mg IM and  Zofran 8 mg ODT.   CT head normal. Labs pending. Discussed with patient and her husband. Also discussed with Dr. Erma HeritageIsaacs.  Final Clinical Impressions(s) / ED Diagnoses   Final diagnoses:  Intractable headache, unspecified chronicity pattern, unspecified headache type    New Prescriptions New Prescriptions   No medications on file     Donnetta Hutchingook, Aashritha Miedema, MD 07/18/17 1616

## 2017-07-18 NOTE — ED Notes (Signed)
Unsuccessful IV attempt x2. IV consult placed. 

## 2017-07-18 NOTE — ED Triage Notes (Addendum)
Patient states yesterday at 1600 patient had blurred vision x 45 minutes, patient states this happened approx 15 years ago and was diagnosed with panic attacks. Patient states she took a Klonopin. Around 1700, patient had a right sided headache, nausea and numbness of the left tongue and whole left side. Patient came to the ED last night and left due to wait, but symptoms went away except for headache, left tongue and face.. Patient called her PCP and was told to come to the ED due to the numbness. Patient states she still has the right-sided headache, numbness of her left tongue and face and feeling weak. Patient states she has had nausea and diarrhea x 2 days and the right-sides headache x 4 days.

## 2017-07-18 NOTE — ED Provider Notes (Signed)
Assumed care from Dr. Adriana Simasook at 4 PM. Briefly, the patient is a 31 y.o. female with PMHx of  has a past medical history of Anxiety; Headache; History of hepatitis; Substance abuse in remission [facility](HCC); and Voiding difficulty. here with worsening R-sided HA with transient neuro deficits. Suspect complex migraine. On further interview, pt admits to increased stressors. CT head neg. Plan to f/u labs, dispo.   Labs Reviewed  COMPREHENSIVE METABOLIC PANEL - Abnormal; Notable for the following:       Result Value   AST 14 (*)    ALT 8 (*)    All other components within normal limits  CBC WITH DIFFERENTIAL/PLATELET  ETHANOL  RAPID URINE DRUG SCREEN, HOSP PERFORMED  PREGNANCY, URINE    Course of Care: -labwork is overall very reassuring. Patient remains neurologically intact. I discussed the likely diagnosis with the patient. Given absence of stroke risk factors and otherwise normal exam, do not feel ischemic etiology is likely. She does admit to increased stressors prior to the onset of her symptoms, followed by headache. Will treat her for complicated migraine, advise supportive care at home, and discharge. No infectious sx. Labs unremarkable.       Shaune PollackIsaacs, Nyasha Rahilly, MD 07/18/17 [phone]

## 2017-08-22 ENCOUNTER — Encounter (HOSPITAL_COMMUNITY): Payer: Self-pay | Admitting: Emergency Medicine

## 2017-08-22 ENCOUNTER — Emergency Department (HOSPITAL_COMMUNITY)
Admission: EM | Admit: 2017-08-22 | Discharge: 2017-08-22 | Disposition: A | Discharge status: Home / Self Care | Payer: BLUE CROSS/BLUE SHIELD | Attending: Emergency Medicine | Admitting: Emergency Medicine

## 2017-08-22 DIAGNOSIS — T401X1A Poisoning by heroin, accidental (unintentional), initial encounter: Secondary | ICD-10-CM | POA: Diagnosis not present

## 2017-08-22 DIAGNOSIS — E161 Other hypoglycemia: Secondary | ICD-10-CM | POA: Diagnosis not present

## 2017-08-22 DIAGNOSIS — Z87891 Personal history of nicotine dependence: Secondary | ICD-10-CM | POA: Diagnosis not present

## 2017-08-22 DIAGNOSIS — Z79899 Other long term (current) drug therapy: Secondary | ICD-10-CM | POA: Diagnosis not present

## 2017-08-22 DIAGNOSIS — E162 Hypoglycemia, unspecified: Secondary | ICD-10-CM | POA: Diagnosis not present

## 2017-08-22 DIAGNOSIS — F1910 Other psychoactive substance abuse, uncomplicated: Secondary | ICD-10-CM | POA: Diagnosis not present

## 2017-08-22 LAB — CBG MONITORING, ED: Glucose-Capillary: 95 mg/dL (ref 65–99)

## 2017-08-22 LAB — RAPID URINE DRUG SCREEN, HOSP PERFORMED
Amphetamines: NOT DETECTED
Barbiturates: NOT DETECTED
Benzodiazepines: POSITIVE — AB
Cocaine: POSITIVE — AB
Opiates: POSITIVE — AB
Tetrahydrocannabinol: NOT DETECTED

## 2017-08-22 LAB — POC URINE PREG, ED: Preg Test, Ur: NEGATIVE

## 2017-08-22 MED ORDER — ONDANSETRON 4 MG PO TBDP
4.0000 mg | ORAL_TABLET | Freq: Once | ORAL | Status: AC
  Administered 2017-08-22: 4 mg via ORAL
  Filled 2017-08-22: qty 1

## 2017-08-22 MED ORDER — IBUPROFEN 200 MG PO TABS
600.0000 mg | ORAL_TABLET | Freq: Once | ORAL | Status: AC
  Administered 2017-08-22: 600 mg via ORAL
  Filled 2017-08-22: qty 3

## 2017-08-22 NOTE — Discharge Instructions (Addendum)
Please see attached resources to get help with substance abuse. If you were able to stay clean for several years then you can do it again. Stop trying to get pregnant until you are clean.

## 2017-08-22 NOTE — ED Notes (Signed)
Bed: ZO10WA23 Expected date:  Expected time:  Means of arrival:  Comments: EMS/overdose, now alert

## 2017-08-22 NOTE — ED Provider Notes (Signed)
[facility]-EMERGENCY DEPT Provider Note   CSN: [id number] Arrival date & time: 08/22/17  1132     History   Chief Complaint Chief Complaint  Patient presents with  . Drug Overdose    HPI Stacey Ayala is a 31 y.o. female.  HPI   31yF with heroin overdose. Unintentional. Denies trying to harm herself. Had agonal breathing on EMS arrival. 1mg  intranasal narcan. Now alert and oriented. Denies any other acute ingestion. Currently feeling anxious. Requesting klonopin. Lower back pain. B/l thigh pain.   Past Medical History:  Diagnosis Date  . Anxiety   . Headache    Migraines  . History of hepatitis    Hep C  . Substance abuse in remission (HCC)   . Voiding difficulty    push to empty bladder    There are no active problems to display for this patient.   Past Surgical History:  Procedure Laterality Date  . CHROMOPERTUBATION Bilateral 05/25/2016   Procedure: CHROMOPERTUBATION/Methylene Blue Hydrotubation;  Surgeon: Genia DelMarie-Lyne Lavoie, MD;  Location: [facility];  Service: Gynecology;  Laterality: Bilateral;  . LAPAROSCOPY N/A 05/25/2016   Procedure: LAPAROSCOPY DIAGNOSTIC/Lysis of Adhesion;  Surgeon: Genia DelMarie-Lyne Lavoie, MD;  Location: [facility] OR[facility];  Service: Gynecology;  Laterality: N/A;    OB History    Gravida Para Term Preterm AB Living   0 0 0 0 0 0   SAB TAB Ectopic Multiple Live Births   0 0 0 0         Home Medications    Prior to Admission medications   Medication Sig Start Date End Date Taking? Authorizing Provider  clonazePAM (KLONOPIN) 1 MG tablet Take 0.5-1 mg by mouth 3 (three) times daily.     [provider]  cyclobenzaprine (FLEXERIL) 10 MG tablet Take 1 tablet (10 mg total) by mouth 2 (two) times daily as needed for muscle spasms. 07/18/17   Shaune PollackIsaacs, Cameron, MD  ibuprofen (ADVIL,MOTRIN) 200 MG tablet Take 800 mg by mouth every 6 (six) hours as needed for fever, headache, mild pain, moderate pain or cramping.    [provider]  ibuprofen (ADVIL,MOTRIN) 600 MG tablet Take 600 mg by mouth every 6 (six) hours as needed for fever, headache, mild pain, moderate pain or cramping.    [provider]  metoCLOPramide (REGLAN) 10 MG tablet Take 1 tablet (10 mg total) by mouth every 8 (eight) hours as needed (headache). 07/18/17   Shaune PollackIsaacs, Cameron, MD  Prenatal Vit-Fe Fumarate-FA (PRENATAL MULTIVITAMIN) TABS tablet Take 1 tablet by mouth daily.    [provider]    Family History Family History  Problem Relation Age of Onset  . Diabetes Mother   . Hypertension Mother   . Breast cancer Paternal Grandmother   . Cancer Paternal Grandmother        melanoma  . Hypertension Paternal Grandmother     Social History Social History   Tobacco Use  . Smoking status: Former Smoker    Packs/day: 0.50    Types: Cigarettes    Last attempt to quit: 06/09/2017    Years since quitting: 0.2  . Smokeless tobacco: Never Used  Substance Use Topics  . Alcohol use: No  . Drug use: No    Comment: almost 2 years clean (10/11/14)     Allergies   Patient has no known allergies.   Review of Systems Review of Systems  All systems reviewed and negative, other than as noted in HPI.  Physical Exam Updated Vital Signs BP  124/83 (BP Location: Left Arm)   Pulse (!) 124   Temp 98 F (36.7 C) (Oral)   Resp 17   SpO2 100%   Physical Exam  Constitutional: She appears well-developed and well-nourished. No distress.  HENT:  Head: Normocephalic and atraumatic.  Eyes: Conjunctivae are normal. Right eye exhibits no discharge. Left eye exhibits no discharge.  Neck: Neck supple.  Cardiovascular: Regular rhythm. Exam reveals no gallop and no friction rub.  No murmur heard. taachycardic  Pulmonary/Chest: Effort normal and breath sounds normal. No respiratory distress.  Abdominal: Soft. She exhibits no distension. There is no tenderness.  Musculoskeletal: She exhibits no edema or tenderness.  Neurological:  She is alert.  Skin: Skin is warm and dry.  Psychiatric:  Anxious. Crying at times.   Nursing note and vitals reviewed.    ED Treatments / Results  Labs (all labs ordered are listed, but only abnormal results are displayed) Labs Reviewed  RAPID URINE DRUG SCREEN, HOSP PERFORMED - Abnormal; Notable for the following components:      Result Value   Opiates POSITIVE (*)    Cocaine POSITIVE (*)    Benzodiazepines POSITIVE (*)    All other components within normal limits  CBG MONITORING, ED  POC URINE PREG, ED    EKG  EKG Interpretation  Date/Time:  Wednesday August 22 2017 14:02:25 EST Ventricular Rate:  95 PR Interval:    QRS Duration: 94 QT Interval:  363 QTC Calculation: 457 R Axis:   -34 Text Interpretation:  Sinus rhythm Probable left ventricular hypertrophy Confirmed by Raeford RazorKohut, Jadesola Poynter [phone]([id number]) on 08/22/2017 2:19:18 PM       Radiology No results found.  Procedures Procedures (including critical care time)  Medications Ordered in ED Medications  ondansetron (ZOFRAN-ODT) disintegrating tablet 4 mg (4 mg Oral Given 08/22/17 1359)  ibuprofen (ADVIL,MOTRIN) tablet 600 mg (600 mg Oral Given 08/22/17 1359)     Initial Impression / Assessment and Plan / ED Course  I have reviewed the triage vital signs and the nursing notes.  Pertinent labs & imaging results that were available during my care of the patient were reviewed by me and considered in my medical decision making (see chart for details).     31yF with unintentional heroin overdose. No SI or HI. Observed for several hours. Somewhat drowsy but awakens easily and argumentative when trying to discuss importance of getting help. Outpt resources. Husband understandably concerned but I find no basis to IVC her.   Final Clinical Impressions(s) / ED Diagnoses   Final diagnoses:  Polysubstance abuse (HCC)  Accidental overdose of heroin, initial encounter [facility](HCC)    ED Discharge Orders    None       Raeford RazorKohut,  Charlene Detter, MD 08/22/17 1438

## 2017-08-22 NOTE — ED Notes (Signed)
Pt attempted to provide urine sample

## 2017-08-22 NOTE — ED Triage Notes (Signed)
Pt from home via EMS following a suspected heroin overdose today. Per pt's husband, pt has been clean until this morning and is currently trying to get pregnant. Per EMS, pt had agonal respirations at time of arrival. Pt had 2, 1mg  IN doses of narcan. Pt is alert and oriented x 4

## 2017-09-04 DIAGNOSIS — F411 Generalized anxiety disorder: Secondary | ICD-10-CM | POA: Diagnosis not present

## 2017-09-06 ENCOUNTER — Ambulatory Visit: Payer: BLUE CROSS/BLUE SHIELD | Admitting: Obstetrics & Gynecology

## 2017-09-06 ENCOUNTER — Encounter: Payer: Self-pay | Admitting: Obstetrics & Gynecology

## 2017-09-06 VITALS — BP 126/78

## 2017-09-06 DIAGNOSIS — F1910 Other psychoactive substance abuse, uncomplicated: Secondary | ICD-10-CM | POA: Diagnosis not present

## 2017-09-06 DIAGNOSIS — N912 Amenorrhea, unspecified: Secondary | ICD-10-CM | POA: Diagnosis not present

## 2017-09-06 DIAGNOSIS — Z3201 Encounter for pregnancy test, result positive: Secondary | ICD-10-CM

## 2017-09-06 DIAGNOSIS — Z3200 Encounter for pregnancy test, result unknown: Secondary | ICD-10-CM

## 2017-09-06 DIAGNOSIS — N926 Irregular menstruation, unspecified: Secondary | ICD-10-CM | POA: Diagnosis not present

## 2017-09-06 LAB — PREGNANCY, URINE: Preg Test, Ur: POSITIVE — AB

## 2017-09-06 MED ORDER — PRENATAL VITAMINS 0.8 MG PO TABS: 1.0000 | ORAL_TABLET | Freq: Every day | ORAL | 4 refills | Status: AC

## 2017-09-06 NOTE — Progress Notes (Signed)
    Stacey Ayala 08/30/1986 [id number]        31 y.o.  G0  RP:  Confirmation of pregnancy for pos HPT  HPI:  LMP 07/28/2017.  Was attempting conception.  Had a positive urine LH on November 7.  Sexually active November 8.  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  [facility] and received Narcan.  Was discharged after a few hours of observation.  [facility] was negative.  Since then has developed sensitive breasts and nausea.  Had a positive home pregnancy test a week ago.  No pelvic pain and no vaginal bleeding.  No history of chlamydia or gonorrhea.  May have had a very early spontaneous complete miscarriage less than a year ago.  Past medical history,surgical history, problem list, medications, allergies, family history and social history were all reviewed and documented in the EPIC chart.  Directed ROS with pertinent positives and negatives documented in the history of present illness/assessment and plan.  Exam:  Vitals:   09/06/17 1102  BP: 126/78   General appearance:  Normal  Abdomen: Soft, nontender, nondistended.  Gyn exam:  Vulva normal.  Bimanual exam: Anteverted uterus normal volume nontender with a closed long cervix.  No adnexal mass and no adnexal tenderness.  Normal vaginal secretions, no blood.  UPT Positive   Assessment/Plan:  31 y.o. G0P0000   1. Encounter for confirmation of pregnancy test result with physical examination Early pregnancy at 5 weeks and 5 days per last menstrual period.  Normal exam today.  Relapse of drug abuse with unintended heroin overdose on November 21.  Urine pregnancy test negative at that time.  Patient motivated to abstain from drugs while pregnant.  Will confirm viability by transvaginal ultrasound in 2 weeks.  Prenatal vitamins prescribed.  Counseling on healthy pregnancy. - US OB Transvaginal;  Future  2. Late menses Positive urine pregnancy test. - Pregnancy, urine  3. Drug abuse (HCC) Relapse of drug abuse with an unintended heroin overdose on August 22, 2017.  [facility].  Urine pregnancy test was negative on November 21 at that visit. - US OB Transvaginal; Future  Counseling on above issues >50% x 25 minutes  Genia DelMarie-Lyne Haidan Nhan MD, 11:12 AM 09/06/2017

## 2017-09-06 NOTE — Patient Instructions (Signed)
1. Encounter for confirmation of pregnancy test result with physical examination Early pregnancy at 5 weeks and 5 days per last menstrual period.  Normal exam today.  Will confirm viability by transvaginal ultrasound in 2 weeks.  Prenatal vitamins prescribed.  Counseling on healthy pregnancy. - US OB Transvaginal; Future  2. Late menses Positive urine pregnancy test. - Pregnancy, urine  Stacey Ayala, it was a pleasure seeing you today!  Congratulations for your pregnancy!  I will see you very soon again for the ultrasound.   Eating Plan for Pregnant Women While you are pregnant, your body will require additional nutrition to help support your growing baby. It is recommended that you consume:  150 additional calories each day during your first trimester.  300 additional calories each day during your second trimester.  300 additional calories each day during your third trimester.  Eating a healthy, well-balanced diet is very important for your health and for your baby's health. You also have a higher need for some vitamins and minerals, such as folic acid, calcium, iron, and vitamin D. What do I need to know about eating during pregnancy?  Do not try to lose weight or go on a diet during pregnancy.  Choose healthy, nutritious foods. Choose  of a sandwich with a glass of milk instead of a candy bar or a high-calorie sugar-sweetened beverage.  Limit your overall intake of foods that have "empty calories." These are foods that have little nutritional value, such as sweets, desserts, candies, sugar-sweetened beverages, and fried foods.  Eat a variety of foods, especially fruits and vegetables.  Take a prenatal vitamin to help meet the additional needs during pregnancy, specifically for folic acid, iron, calcium, and vitamin D.  Remember to stay active. Ask your health care provider for exercise recommendations that are specific to you.  Practice good food safety and cleanliness, such as  washing your hands before you eat and after you prepare raw meat. This helps to prevent foodborne illnesses, such as listeriosis, that can be very dangerous for your baby. Ask your health care provider for more information about listeriosis. What does 150 extra calories look like? Healthy options for an additional 150 calories each day could be any of the following:  Plain low-fat yogurt (6-8 oz) with  cup of berries.  1 apple with 2 teaspoons of peanut butter.  Cut-up vegetables with  cup of hummus.  Low-fat chocolate milk (8 oz or 1 cup).  1 string cheese with 1 medium orange.   of a peanut butter and jelly sandwich on whole-wheat bread (1 tsp of peanut butter).  For 300 calories, you could eat two of those healthy options each day. What is a healthy amount of weight to gain? The recommended amount of weight for you to gain is based on your pre-pregnancy BMI. If your pre-pregnancy BMI was:  Less than 18 (underweight), you should gain 28-40 lb.  18-24.9 (normal), you should gain 25-35 lb.  25-29.9 (overweight), you should gain 15-25 lb.  Greater than 30 (obese), you should gain 11-20 lb.  What if I am having twins or multiples? Generally, pregnant women who will be having twins or multiples may need to increase their daily calories by 300-600 calories each day. The recommended range for total weight gain is 25-54 lb, depending on your pre-pregnancy BMI. Talk with your health care provider for specific guidance about additional nutritional needs, weight gain, and exercise during your pregnancy. What foods can I eat? Grains Any grains. Try to choose  whole grains, such as whole-wheat bread, oatmeal, or brown rice. Vegetables Any vegetables. Try to eat a variety of colors and types of vegetables to get a full range of vitamins and minerals. Remember to wash your vegetables well before eating. Fruits Any fruits. Try to eat a variety of colors and types of fruit to get a full range  of vitamins and minerals. Remember to wash your fruits well before eating. Meats and Other Protein Sources Lean meats, including chicken, Mal[place]key, fish, and lean cuts of beef, veal, or pork. Make sure that all meats are cooked to "well done." Tofu. Tempeh. Beans. Eggs. Peanut butter and other nut butters. Seafood, such as shrimp, crab, and lobster. If you choose fish, select types that are higher in omega-3 fatty acids, including salmon, herring, mussels, trout, sardines, and pollock. Make sure that all meats are cooked to food-safe temperatures. Dairy Pasteurized milk and milk alternatives. Pasteurized yogurt and pasteurized cheese. Cottage cheese. Sour cream. Beverages Water. Juices that contain 100% fruit juice or vegetable juice. Caffeine-free teas and decaffeinated coffee. Drinks that contain caffeine are okay to drink, but it is better to avoid caffeine. Keep your total caffeine intake to less than 200 mg each day (12 oz of coffee, tea, or soda) or as directed by your health care provider. Condiments Any pasteurized condiments. Sweets and Desserts Any sweets and desserts. Fats and Oils Any fats and oils. The items listed above may not be a complete list of recommended foods or beverages. Contact your dietitian for more options. What foods are not recommended? Vegetables Unpasteurized (raw) vegetable juices. Fruits Unpasteurized (raw) fruit juices. Meats and Other Protein Sources Cured meats that have nitrates, such as bacon, salami, and hotdogs. Luncheon meats, bologna, or other deli meats (unless they are reheated until they are steaming hot). Refrigerated pate, meat spreads from a meat counter, smoked seafood that is found in the refrigerated section of a store. Raw fish, such as sushi or sashimi. High mercury content fish, such as tilefish, shark, swordfish, and king mackerel. Raw meats, such as tuna or beef tartare. Undercooked meats and poultry. Make sure that all meats are cooked to  food-safe temperatures. Dairy Unpasteurized (raw) milk and any foods that have raw milk in them. Soft cheeses, such as feta, queso blanco, queso fresco, Brie, Camembert cheeses, blue-veined cheeses, and Panela cheese (unless it is made with pasteurized milk, which must be stated on the label). Beverages Alcohol. Sugar-sweetened beverages, such as sodas, teas, or energy drinks. Condiments Homemade fermented foods and drinks, such as pickles, sauerkraut, or kombucha drinks. (Store-bought pasteurized versions of these are okay.) Other Salads that are made in the store, such as ham salad, chicken salad, egg salad, tuna salad, and seafood salad. The items listed above may not be a complete list of foods and beverages to avoid. Contact your dietitian for more information. This information is not intended to replace advice given to you by your health care provider. Make sure you discuss any questions you have with your health care provider. Document Released: 07/03/2014 Document Revised: 02/24/2016 Document Reviewed: 03/03/2014 Elsevier Interactive Patient Education  [employer]2018 [employer]

## 2017-09-07 DIAGNOSIS — F411 Generalized anxiety disorder: Secondary | ICD-10-CM | POA: Diagnosis not present

## 2017-09-19 ENCOUNTER — Encounter: Payer: Self-pay | Admitting: Obstetrics & Gynecology

## 2017-09-19 ENCOUNTER — Other Ambulatory Visit: Payer: Self-pay | Admitting: Obstetrics & Gynecology

## 2017-09-19 ENCOUNTER — Ambulatory Visit (INDEPENDENT_AMBULATORY_CARE_PROVIDER_SITE_OTHER): Payer: BLUE CROSS/BLUE SHIELD

## 2017-09-19 ENCOUNTER — Ambulatory Visit: Payer: BLUE CROSS/BLUE SHIELD | Admitting: Obstetrics & Gynecology

## 2017-09-19 DIAGNOSIS — Z3491 Encounter for supervision of normal pregnancy, unspecified, first trimester: Secondary | ICD-10-CM

## 2017-09-19 DIAGNOSIS — O3680X0 Pregnancy with inconclusive fetal viability, not applicable or unspecified: Secondary | ICD-10-CM

## 2017-09-19 DIAGNOSIS — F1910 Other psychoactive substance abuse, uncomplicated: Secondary | ICD-10-CM

## 2017-09-19 DIAGNOSIS — O3680X1 Pregnancy with inconclusive fetal viability, fetus 1: Secondary | ICD-10-CM

## 2017-09-19 DIAGNOSIS — Z3201 Encounter for pregnancy test, result positive: Secondary | ICD-10-CM | POA: Diagnosis not present

## 2017-09-19 DIAGNOSIS — Z87898 Personal history of other specified conditions: Secondary | ICD-10-CM | POA: Diagnosis not present

## 2017-09-19 DIAGNOSIS — F1991 Other psychoactive substance use, unspecified, in remission: Secondary | ICD-10-CM

## 2017-09-19 DIAGNOSIS — F419 Anxiety disorder, unspecified: Secondary | ICD-10-CM | POA: Diagnosis not present

## 2017-09-19 DIAGNOSIS — Z3200 Encounter for pregnancy test, result unknown: Secondary | ICD-10-CM

## 2017-09-19 DIAGNOSIS — F411 Generalized anxiety disorder: Secondary | ICD-10-CM | POA: Diagnosis not present

## 2017-09-19 MED ORDER — CLONAZEPAM 1 MG PO TABS: 1.0000 mg | ORAL_TABLET | Freq: Three times a day (TID) | ORAL | 5 refills | Status: DC | PRN

## 2017-09-19 NOTE — Progress Notes (Signed)
    Stacey HuskyBrittany Ayala 07/16/1986 [id number]        31 y.o.  G1P0 7+ weeks  RP:  Ob US viability  HPI:  Since last visit, mild nausea, no vomiting, no vaginal bleeding and no pelvic pain.  No alcohol or drug consumption.  Last visit on 12/6th we noted:   LMP 07/28/2017.  Was attempting conception.  Had a positive urine LH on November 7.  Sexually active November 8.  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  [facility] and received Narcan.  Was discharged after a few hours of observation.  The [facility] pregnancy [facility] on November 21 was negative.  Since then has developed sensitive breasts and nausea.  Had a positive home pregnancy test a week ago. No pelvic pain and no vaginal bleeding.  No history of chlamydia or gonorrhea.  May have had a very early spontaneous complete miscarriage less than a year ago, but not clinically confirmed.  Past medical history,surgical history, problem list, medications, allergies, family history and social history were all reviewed and documented in the EPIC chart.  Directed ROS with pertinent positives and negatives documented in the history of present illness/assessment and plan.  Exam:  There were no vitals filed for this visit. General appearance:  Normal  Ob US today: T/V anteverted uterus with living intrauterine pregnancy seen in the fundus.  Size equal dates by last menstrual period dates.  Crown rump length of 7 weeks and 3 days.  Expected date of delivery by last menstrual period is May 04, 2018 and by ultrasound May 05, 2018.  Fetal heart rate seen at 166 bpm.  Amnion seen.  Normal yolk sac seen.  Right ovary normal left ovary with a small corpus luteum cyst measuring 2.1 x 1.5 cm.  No free fluid in the posterior cul-de-sac.  Cervix is long and closed.   Assessment/Plan:  31 y.o. G1P0000   1. Pregnancy with uncertain fetal viability, single or  unspecified fetus Normal single intrauterine pregnancy at 7 weeks and 3 days with a fetal heart rate at 166 bpm.  Expected date of delivery May 04, 2018, dating corresponds with ultrasound.  Continue on prenatal vitamins.  Nutrition and physical activity discussed.  Patient is very happy to be pregnant and willing to do everything possible to have a healthy pregnancy and baby.  Father of baby present at the visit, very supportive.  Will [facility] to [facility] Vivian[facility]are[facility] Fogelman [facility].  Patient will organize her new OB visit within the next 2 weeks.  2. Anxiety Anxiety well controlled on clonazepam 1 mg TID PRN represcribed.  Patient on no other medication.  Not consuming any drugs since the heroin overdose on November 21.  Given patient's history of drug addiction, controlling her anxiety with clonazepam is worth the risks.  Patient understands that taking the least clonazepam necessary is best especially during the embryonic period up to [redacted] weeks gestation.  3. History of drug use As above.  Counseling on above issues more than 50% for 15 minutes.  Genia DelMarie-Lyne Clee Pandit MD, 3:21 PM 09/19/2017

## 2017-09-23 NOTE — Patient Instructions (Signed)
1. Pregnancy with uncertain fetal viability, single or unspecified fetus Normal single intrauterine pregnancy at 7 weeks and 3 days with a fetal heart rate at 166 bpm.  Expected date of delivery May 04, 2018, dating corresponds with ultrasound.  Continue on prenatal vitamins.  Nutrition and physical activity discussed.  Patient is very happy to be pregnant and willing to do everything possible to have a healthy pregnancy and baby.  Father of baby present at the visit, very supportive.  [facility].  Patient will organize her new OB visit within the next 2 weeks.  2. Anxiety Anxiety well controlled on clonazepam 1 mg TID PRN represcribed.  Patient on no other medication.  Not consuming any drugs since the heroin overdose on November 21.  Given patient's history of drug addiction, controlling her anxiety with clonazepam is worth the risks.  Patient understands that taking the least clonazepam necessary is best especially during the embryonic period up to [redacted] weeks gestation.  3. History of drug use As above.  Stacey Ayala, I really enjoyed sharing the good news with you today!  Congratulations on your pregnancy and the best to your family!  Please update me as you progress.   Eating Plan for Pregnant Women While you are pregnant, your body will require additional nutrition to help support your growing baby. It is recommended that you consume:  150 additional calories each day during your first trimester.  300 additional calories each day during your second trimester.  300 additional calories each day during your third trimester.  Eating a healthy, well-balanced diet is very important for your health and for your baby's health. You also have a higher need for some vitamins and minerals, such as folic acid, calcium, iron, and vitamin D. What do I need to know about eating during pregnancy?  Do not try to lose weight or go on a diet during  pregnancy.  Choose healthy, nutritious foods. Choose  of a sandwich with a glass of milk instead of a candy bar or a high-calorie sugar-sweetened beverage.  Limit your overall intake of foods that have "empty calories." These are foods that have little nutritional value, such as sweets, desserts, candies, sugar-sweetened beverages, and fried foods.  Eat a variety of foods, especially fruits and vegetables.  Take a prenatal vitamin to help meet the additional needs during pregnancy, specifically for folic acid, iron, calcium, and vitamin D.  Remember to stay active. Ask your health care provider for exercise recommendations that are specific to you.  Practice good food safety and cleanliness, such as washing your hands before you eat and after you prepare raw meat. This helps to prevent foodborne illnesses, such as listeriosis, that can be very dangerous for your baby. Ask your health care provider for more information about listeriosis. What does 150 extra calories look like? Healthy options for an additional 150 calories each day could be any of the following:  Plain low-fat yogurt (6-8 oz) with  cup of berries.  1 apple with 2 teaspoons of peanut butter.  Cut-up vegetables with  cup of hummus.  Low-fat chocolate milk (8 oz or 1 cup).  1 string cheese with 1 medium orange.   of a peanut butter and jelly sandwich on whole-wheat bread (1 tsp of peanut butter).  For 300 calories, you could eat two of those healthy options each day. What is a healthy amount of weight to gain? The recommended amount of weight for  you to gain is based on your pre-pregnancy BMI. If your pre-pregnancy BMI was:  Less than 18 (underweight), you should gain 28-40 lb.  18-24.9 (normal), you should gain 25-35 lb.  25-29.9 (overweight), you should gain 15-25 lb.  Greater than 30 (obese), you should gain 11-20 lb.  What if I am having twins or multiples? Generally, pregnant women who will be having  twins or multiples may need to increase their daily calories by 300-600 calories each day. The recommended range for total weight gain is 25-54 lb, depending on your pre-pregnancy BMI. Talk with your health care provider for specific guidance about additional nutritional needs, weight gain, and exercise during your pregnancy. What foods can I eat? Grains Any grains. Try to choose whole grains, such as whole-wheat bread, oatmeal, or brown rice. Vegetables Any vegetables. Try to eat a variety of colors and types of vegetables to get a full range of vitamins and minerals. Remember to wash your vegetables well before eating. Fruits Any fruits. Try to eat a variety of colors and types of fruit to get a full range of vitamins and minerals. Remember to wash your fruits well before eating. Meats and Other Protein Sources Lean meats, including chicken, [place], fish, and lean cuts of beef, veal, or pork. Make sure that all meats are cooked to "well done." Tofu. Tempeh. Beans. Eggs. Peanut butter and other nut butters. Seafood, such as shrimp, crab, and lobster. If you choose fish, select types that are higher in omega-3 fatty acids, including salmon, herring, mussels, trout, sardines, and pollock. Make sure that all meats are cooked to food-safe temperatures. Dairy Pasteurized milk and milk alternatives. Pasteurized yogurt and pasteurized cheese. Cottage cheese. Sour cream. Beverages Water. Juices that contain 100% fruit juice or vegetable juice. Caffeine-free teas and decaffeinated coffee. Drinks that contain caffeine are okay to drink, but it is better to avoid caffeine. Keep your total caffeine intake to less than 200 mg each day (12 oz of coffee, tea, or soda) or as directed by your health care provider. Condiments Any pasteurized condiments. Sweets and Desserts Any sweets and desserts. Fats and Oils Any fats and oils. The items listed above may not be a complete list of recommended foods or beverages.  Contact your dietitian for more options. What foods are not recommended? Vegetables Unpasteurized (raw) vegetable juices. Fruits Unpasteurized (raw) fruit juices. Meats and Other Protein Sources Cured meats that have nitrates, such as bacon, salami, and hotdogs. Luncheon meats, bologna, or other deli meats (unless they are reheated until they are steaming hot). Refrigerated pate, meat spreads from a meat counter, smoked seafood that is found in the refrigerated section of a store. Raw fish, such as sushi or sashimi. High mercury content fish, such as tilefish, shark, swordfish, and king mackerel. Raw meats, such as tuna or beef tartare. Undercooked meats and poultry. Make sure that all meats are cooked to food-safe temperatures. Dairy Unpasteurized (raw) milk and any foods that have raw milk in them. Soft cheeses, such as feta, queso blanco, queso fresco, Brie, Camembert cheeses, blue-veined cheeses, and Panela cheese (unless it is made with pasteurized milk, which must be stated on the label). Beverages Alcohol. Sugar-sweetened beverages, such as sodas, teas, or energy drinks. Condiments Homemade fermented foods and drinks, such as pickles, sauerkraut, or kombucha drinks. (Store-bought pasteurized versions of these are okay.) Other Salads that are made in the store, such as ham salad, chicken salad, egg salad, tuna salad, and seafood salad. The items listed above may not  be a complete list of foods and beverages to avoid. Contact your dietitian for more information. This information is not intended to replace advice given to you by your health care provider. Make sure you discuss any questions you have with your health care provider. Document Released: 07/03/2014 Document Revised: 02/24/2016 Document Reviewed: 03/03/2014 Elsevier Interactive Patient Education  [employer]2018 [employer].

## 2017-09-26 DIAGNOSIS — F411 Generalized anxiety disorder: Secondary | ICD-10-CM | POA: Diagnosis not present

## 2017-09-28 MED ORDER — CLONAZEPAM 1 MG PO TABS: 1.0000 mg | ORAL_TABLET | Freq: Three times a day (TID) | ORAL | 5 refills | Status: AC | PRN

## 2017-09-28 NOTE — Addendum Note (Signed)
Addended by: Genia DelLAVOIE, MARIE-LYNE on: 09/28/2017 10:18 PM   Modules accepted: Orders

## 2017-10-02 NOTE — L&D Delivery Note (Signed)
Delivery Note At 4:31 AM a viable and healthy female was delivered via Vaginal, Spontaneous (Presentation: ;LOA).  APGAR: 8, 9; weight  pending.   Placenta status: spontaneous, intact.  Cord:  with the following complications: loose [place] x one reduced.  Cord pH: na  Anesthesia: epdiurea   Episiotomy: None Lacerations: 2nd degree Suture Repair: 2.0 vicryl rapide Est. Blood Loss (mL): 150  Mom to postpartum.  Baby to Couplet care / Skin to Skin.  Aiyah Scarpelli J 05/08/2018, 4:45 AM

## 2017-10-03 ENCOUNTER — Telehealth: Payer: Self-pay | Admitting: *Deleted

## 2017-10-03 DIAGNOSIS — F411 Generalized anxiety disorder: Secondary | ICD-10-CM | POA: Diagnosis not present

## 2017-10-03 NOTE — Telephone Encounter (Signed)
Patient called stating a 90 day supply of klonopin 1 mg normal is sent to pharmacy, I checked and 90 day supply was sent. I called and left on pt voicemail this was done on 09/28/17

## 2017-10-05 DIAGNOSIS — Z23 Encounter for immunization: Secondary | ICD-10-CM | POA: Diagnosis not present

## 2017-10-05 DIAGNOSIS — Z3401 Encounter for supervision of normal first pregnancy, first trimester: Secondary | ICD-10-CM | POA: Diagnosis not present

## 2017-10-05 DIAGNOSIS — Z118 Encounter for screening for other infectious and parasitic diseases: Secondary | ICD-10-CM | POA: Diagnosis not present

## 2017-10-05 DIAGNOSIS — Z113 Encounter for screening for infections with a predominantly sexual mode of transmission: Secondary | ICD-10-CM | POA: Diagnosis not present

## 2017-10-05 DIAGNOSIS — Z3689 Encounter for other specified antenatal screening: Secondary | ICD-10-CM | POA: Diagnosis not present

## 2017-10-05 LAB — OB RESULTS CONSOLE ABO/RH: RH TYPE: POSITIVE

## 2017-10-05 LAB — OB RESULTS CONSOLE GC/CHLAMYDIA
Chlamydia: NEGATIVE
Gonorrhea: NEGATIVE

## 2017-10-05 LAB — OB RESULTS CONSOLE HEPATITIS B SURFACE ANTIGEN: HEP B S AG: NEGATIVE

## 2017-10-05 LAB — OB RESULTS CONSOLE HIV ANTIBODY (ROUTINE TESTING): HIV: NONREACTIVE

## 2017-10-05 LAB — OB RESULTS CONSOLE RUBELLA ANTIBODY, IGM: Rubella: NON-IMMUNE/NOT IMMUNE

## 2017-10-05 LAB — OB RESULTS CONSOLE RPR: RPR: NONREACTIVE

## 2017-10-05 LAB — OB RESULTS CONSOLE ANTIBODY SCREEN: Antibody Screen: NEGATIVE

## 2017-10-08 ENCOUNTER — Ambulatory Visit: Payer: BLUE CROSS/BLUE SHIELD | Admitting: Obstetrics & Gynecology

## 2017-10-08 DIAGNOSIS — F1991 Other psychoactive substance use, unspecified, in remission: Secondary | ICD-10-CM

## 2017-10-08 DIAGNOSIS — Z3A10 10 weeks gestation of pregnancy: Secondary | ICD-10-CM | POA: Diagnosis not present

## 2017-10-08 DIAGNOSIS — Z87898 Personal history of other specified conditions: Secondary | ICD-10-CM | POA: Diagnosis not present

## 2017-10-08 DIAGNOSIS — F419 Anxiety disorder, unspecified: Secondary | ICD-10-CM

## 2017-10-08 NOTE — Progress Notes (Signed)
    Stacey HuskyBrittany Ayala 08/20/1986 [id number]        32 y.o.  G1P0000 at 10 2/[redacted] weeks gestation  RP:  Counseling about anxiety on Klonipin  HPI: Patient is successfully controlling her anxiety with Klonopin.  She was taking 3 tablets a day and now was able to decrease to twice a day.  Has not been able to manage her anxiety with other medication in the past.  Has a history of drug addiction with an unintended heroin overdose after a relapse of 3 days in November 2018.  Patient is happy to be pregnant and wants to do the best for the baby, but is worried of the risk of drug relapse if she is not able to control her anxiety.  Patient [facility] to take over her OB care, but did visit did not go well according to the patient who felt misunderstood.  She reports crying during the visit and feeling very anxious since then.  She was told to stop the Klonopin and start on BuSpar.  Patient wants to see at different obstetrician.  Denies taking any other medication and is not consuming drugs or alcohol.  She is taking her prenatal vitamins.  She has a good nutrition.  No vomiting.  No pelvic pain.  No vaginal bleeding.  Past medical history,surgical history, problem list, medications, allergies, family history and social history were all reviewed and documented in the EPIC chart.  Directed ROS with pertinent positives and negatives documented in the history of present illness/assessment and plan.  Exam:  There were no vitals filed for this visit. General appearance:  Normal  Abdomen normal.  Fetal heart tone in the 160s per minute.   Assessment/Plan:  32 y.o. G1P0000   1. Anxiety Controlled on Klonopin 1 mg twice a day currently.  Discussed with patient that there is a small risk of malformation with Klonopin, but if her risk of getting severe anxiety that would put her at risk of drug relapse, she is better to continue on the Klonopin at the lowest dose that will control  her symptoms.  Encouraged to be physically active, to go for walks, which will release her endorphins and contribute to more calmness.  Patient's history and current situation discussed with [facility].  Patient will transfer her OB care to him.  2. History of drug use No current consumption.  3. [redacted] weeks gestation of pregnancy Normal 1st trimester pregnancy with good FHT.  Follow-[facility].  Counseling on above issues >50% x 25 minutes.  Stacey DelMarie-Lyne Anaira Seay MD, 4:27 PM 10/08/2017

## 2017-10-10 ENCOUNTER — Encounter: Payer: Self-pay | Admitting: Obstetrics & Gynecology

## 2017-10-10 DIAGNOSIS — F411 Generalized anxiety disorder: Secondary | ICD-10-CM | POA: Diagnosis not present

## 2017-10-10 NOTE — Patient Instructions (Signed)
1. Anxiety Controlled on Klonopin 1 mg twice a day currently.  Discussed with patient that there is a small risk of malformation with Klonopin, but if her risk of getting severe anxiety that would put her at risk of drug relapse, she is better to continue on the Klonopin at the lowest dose that will control her symptoms.  Encouraged to be physically active, to go for walks, which will release her endorphins and contribute to more calmness.  Patient's history and current situation discussed with [facility].  Patient will transfer her OB care to him.  2. History of drug use No current consumption.  3. [redacted] weeks gestation of pregnancy Normal 1st trimester pregnancy with good FHT.  Follow-[facility].  [name], good seeing you today!

## 2017-10-17 ENCOUNTER — Encounter (HOSPITAL_COMMUNITY): Payer: Self-pay | Admitting: *Deleted

## 2017-10-17 ENCOUNTER — Inpatient Hospital Stay (HOSPITAL_COMMUNITY)
Admission: AD | Admit: 2017-10-17 | Discharge: 2017-10-17 | Disposition: A | Discharge status: Home / Self Care | Payer: BLUE CROSS/BLUE SHIELD | Source: Ambulatory Visit | Attending: Obstetrics & Gynecology | Admitting: Obstetrics & Gynecology

## 2017-10-17 DIAGNOSIS — Z87891 Personal history of nicotine dependence: Secondary | ICD-10-CM | POA: Insufficient documentation

## 2017-10-17 DIAGNOSIS — N898 Other specified noninflammatory disorders of vagina: Secondary | ICD-10-CM | POA: Diagnosis not present

## 2017-10-17 DIAGNOSIS — R197 Diarrhea, unspecified: Secondary | ICD-10-CM | POA: Diagnosis present

## 2017-10-17 DIAGNOSIS — G43909 Migraine, unspecified, not intractable, without status migrainosus: Secondary | ICD-10-CM | POA: Insufficient documentation

## 2017-10-17 DIAGNOSIS — B1920 Unspecified viral hepatitis C without hepatic coma: Secondary | ICD-10-CM | POA: Insufficient documentation

## 2017-10-17 DIAGNOSIS — O99611 Diseases of the digestive system complicating pregnancy, first trimester: Secondary | ICD-10-CM | POA: Insufficient documentation

## 2017-10-17 DIAGNOSIS — O26891 Other specified pregnancy related conditions, first trimester: Secondary | ICD-10-CM | POA: Diagnosis not present

## 2017-10-17 DIAGNOSIS — R109 Unspecified abdominal pain: Secondary | ICD-10-CM | POA: Diagnosis not present

## 2017-10-17 DIAGNOSIS — A0811 Acute gastroenteropathy due to Norwalk agent: Secondary | ICD-10-CM | POA: Diagnosis not present

## 2017-10-17 DIAGNOSIS — Z3A11 11 weeks gestation of pregnancy: Secondary | ICD-10-CM | POA: Insufficient documentation

## 2017-10-17 LAB — URINALYSIS, ROUTINE W REFLEX MICROSCOPIC
BILIRUBIN URINE: NEGATIVE
Glucose, UA: NEGATIVE mg/dL
Ketones, ur: NEGATIVE mg/dL
Nitrite: NEGATIVE
PH: 6 (ref 5.0–8.0)
Protein, ur: NEGATIVE mg/dL
SPECIFIC GRAVITY, URINE: 1.015 (ref 1.005–1.030)

## 2017-10-17 LAB — WET PREP, GENITAL
Clue Cells Wet Prep HPF POC: NONE SEEN
Sperm: NONE SEEN
Trich, Wet Prep: NONE SEEN
Yeast Wet Prep HPF POC: NONE SEEN

## 2017-10-17 NOTE — MAU Note (Signed)
Pt reports she awakened feeling cold symptoms, then she began having lower abd pain, cramping, dizziness, and diarrhea

## 2017-10-17 NOTE — MAU Provider Note (Signed)
Chief Complaint: Abdominal Pain and Diarrhea   First Provider Initiated Contact with Patient 10/17/17 1617      SUBJECTIVE HPI: Stacey Ayala is a 32 y.o. G2P0010 at [id number] by LMP who presents to maternity admissions reporting abdominal pain, diarrhea, cold symptoms, dizziness and vaginal discharge. She reports that she woke up this morning not feeling well with nausea and went to the restroom where she had diarrhea. Since the first episode of diarrhea she has had 5-6 episodes. She reports that the abdominal pain started after the multiple episodes of diarrhea. She currently rates it a 2/10 and states it has gotten better over the course of the day- has not taken any medication for pain. She reports that she had a mild odor a week ago when she had discharge. Does not currently have odor but complains of white discharge that is thin. She denies vaginal bleeding, vaginal itching/burning, urinary symptoms, h/a, or fever/chills.    Past Medical History:  Diagnosis Date  . Anxiety   . Headache    Migraines  . History of hepatitis    Hep C  . Substance abuse in remission (HCC)   . Voiding difficulty    push to empty bladder   Past Surgical History:  Procedure Laterality Date  . CHROMOPERTUBATION Bilateral 05/25/2016   Procedure: CHROMOPERTUBATION/Methylene Blue Hydrotubation;  Surgeon: Genia DelMarie-Lyne Lavoie, MD;  Location: [facility];  Service: Gynecology;  Laterality: Bilateral;  . LAPAROSCOPY N/A 05/25/2016   Procedure: LAPAROSCOPY DIAGNOSTIC/Lysis of Adhesion;  Surgeon: Genia DelMarie-Lyne Lavoie, MD;  Location: [facility];  Service: Gynecology;  Laterality: N/A;   Social History   Socioeconomic History  . Marital status: Married    Spouse name: Not on file  . Number of children: Not on file  . Years of education: Not on file  . Highest education level: Not on file  Social Needs  . Financial resource strain: Not on file  . Food insecurity - worry: Not on file  . Food insecurity - inability: Not on file   . Transportation needs - medical: Not on file  . Transportation needs - non-medical: Not on file  Occupational History  . Not on file  Tobacco Use  . Smoking status: Former Smoker    Packs/day: 0.50    Types: Cigarettes    Last attempt to quit: 06/09/2017    Years since quitting: 0.3  . Smokeless tobacco: Never Used  Substance and Sexual Activity  . Alcohol use: No  . Drug use: No    Comment: almost 2 years clean (10/11/14)  . Sexual activity: Yes    Partners: Male    Birth control/protection: None    Comment: 1st intercourse- [age], partners- 25, married- 4 yrs   Other Topics Concern  . Not on file  Social History Narrative  . Not on file   No current facility-administered medications on file prior to encounter.    Current Outpatient Medications on File Prior to Encounter  Medication Sig Dispense Refill  . clonazePAM (KLONOPIN) 1 MG tablet Take 1 tablet (1 mg total) by mouth 3 (three) times daily as needed for anxiety. 90 tablet 5  . Prenatal Multivit-Min-Fe-FA (PRENATAL VITAMINS) 0.8 MG tablet Take 1 tablet by mouth daily. 90 tablet 4   No Known Allergies  ROS:  Review of Systems  Constitutional: Negative.   Respiratory: Negative.   Cardiovascular: Negative.   Gastrointestinal: Positive for abdominal pain, diarrhea, nausea and vomiting.  Genitourinary: Positive for vaginal discharge. Negative for difficulty urinating, dysuria, frequency, pelvic pain,  urgency, vaginal bleeding and vaginal pain.  Musculoskeletal: Negative.   Neurological: Positive for dizziness.  Psychiatric/Behavioral: Negative.    I have reviewed patient's Past Medical Hx, Surgical Hx, Family Hx, Social Hx, medications and allergies.   Physical Exam   Patient Vitals for the past 24 hrs:  BP Temp Temp src Pulse Resp SpO2 Height Weight  10/17/17 1747 122/67 - - 86 - - - -  10/17/17 1602 115/63 - - 77 - - - -  10/17/17 1525 (!) 107/55 97.7 F (36.5 C) Oral 85 16 100 % 5\' 10"  (1.778 m) 167 lb (75.8 kg)    Constitutional: Well-developed, well-nourished female in no acute distress.  Cardiovascular: normal rate Respiratory: normal effort GI: Abd soft, non-tender. Pos BS x 4 MS: Extremities nontender, no edema, normal ROM Neurologic: Alert and oriented x 4.  GU: Neg CVAT.  PELVIC EXAM: Cervix pink, visually closed, without lesion, scant white creamy discharge, vaginal walls and external genitalia normal Bimanual exam: Cervix 0/long/high, firm, anterior, neg CMT, uterus nontender, nonenlarged, adnexa without tenderness, enlargement, or mass  FHT 158 by doppler  LAB RESULTS Results for orders placed or performed during the hospital encounter of 10/17/17 (from the past 24 hour(s))  Urinalysis, Routine w reflex microscopic     Status: Abnormal   Collection Time: 10/17/17  3:29 PM  Result Value Ref Range   Color, Urine YELLOW YELLOW   APPearance HAZY (A) CLEAR   Specific Gravity, Urine 1.015 1.005 - 1.030   pH 6.0 5.0 - 8.0   Glucose, UA NEGATIVE NEGATIVE mg/dL   Hgb urine dipstick SMALL (A) NEGATIVE   Bilirubin Urine NEGATIVE NEGATIVE   Ketones, ur NEGATIVE NEGATIVE mg/dL   Protein, ur NEGATIVE NEGATIVE mg/dL   Nitrite NEGATIVE NEGATIVE   Leukocytes, UA TRACE (A) NEGATIVE   RBC / HPF TOO NUMEROUS TO COUNT 0 - 5 RBC/hpf   WBC, UA 0-5 0 - 5 WBC/hpf   Bacteria, UA MANY (A) NONE SEEN   Squamous Epithelial / LPF 6-30 (A) NONE SEEN   Mucus PRESENT   Wet prep, genital     Status: Abnormal   Collection Time: 10/17/17  4:40 PM  Result Value Ref Range   Yeast Wet Prep HPF POC NONE SEEN NONE SEEN   Trich, Wet Prep NONE SEEN NONE SEEN   Clue Cells Wet Prep HPF POC NONE SEEN NONE SEEN   WBC, Wet Prep HPF POC FEW (A) NONE SEEN   Sperm NONE SEEN     MAU Management/MDM: Orders Placed This Encounter  Procedures  . Wet prep, genital  . Urinalysis, Routine w reflex microscopic  Wet prep- normal Urine Culture- pending   Consult Dr Juliene Pina. Pt discharged. Pt stable at time of  discharge.  ASSESSMENT 1. Norovirus   2. Abdominal pain during pregnancy in first trimester   3. Vaginal discharge during pregnancy in first trimester    PLAN Discharge home Follow up as scheduled in the office  Return to MAU as needed for emergencies    Allergies as of 10/17/2017   No Known Allergies     Medication List    TAKE these medications   clonazePAM 1 MG tablet Commonly known as:  KLONOPIN Take 1 tablet (1 mg total) by mouth 3 (three) times daily as needed for anxiety.   Prenatal Vitamins 0.8 MG tablet Take 1 tablet by mouth daily.      Steward Drone  Certified Nurse-Midwife 10/17/2017  4:55 PM   \

## 2017-10-17 NOTE — Discharge Instructions (Signed)

## 2017-10-18 DIAGNOSIS — Z3401 Encounter for supervision of normal first pregnancy, first trimester: Secondary | ICD-10-CM | POA: Diagnosis not present

## 2017-10-18 DIAGNOSIS — Z36 Encounter for antenatal screening for chromosomal anomalies: Secondary | ICD-10-CM | POA: Diagnosis not present

## 2017-10-18 DIAGNOSIS — Z3491 Encounter for supervision of normal pregnancy, unspecified, first trimester: Secondary | ICD-10-CM | POA: Diagnosis not present

## 2017-10-18 DIAGNOSIS — Z3682 Encounter for antenatal screening for nuchal translucency: Secondary | ICD-10-CM | POA: Diagnosis not present

## 2017-10-18 LAB — CULTURE, OB URINE: Culture: NO GROWTH

## 2017-10-23 DIAGNOSIS — Z3682 Encounter for antenatal screening for nuchal translucency: Secondary | ICD-10-CM | POA: Diagnosis not present

## 2017-10-23 DIAGNOSIS — Z3401 Encounter for supervision of normal first pregnancy, first trimester: Secondary | ICD-10-CM | POA: Diagnosis not present

## 2017-10-24 DIAGNOSIS — F411 Generalized anxiety disorder: Secondary | ICD-10-CM | POA: Diagnosis not present

## 2017-11-02 DIAGNOSIS — F411 Generalized anxiety disorder: Secondary | ICD-10-CM | POA: Diagnosis not present

## 2017-11-08 DIAGNOSIS — F411 Generalized anxiety disorder: Secondary | ICD-10-CM | POA: Diagnosis not present

## 2017-11-28 DIAGNOSIS — F411 Generalized anxiety disorder: Secondary | ICD-10-CM | POA: Diagnosis not present

## 2017-11-29 DIAGNOSIS — Z3402 Encounter for supervision of normal first pregnancy, second trimester: Secondary | ICD-10-CM | POA: Diagnosis not present

## 2017-11-29 DIAGNOSIS — Z113 Encounter for screening for infections with a predominantly sexual mode of transmission: Secondary | ICD-10-CM | POA: Diagnosis not present

## 2017-11-29 DIAGNOSIS — Z1159 Encounter for screening for other viral diseases: Secondary | ICD-10-CM | POA: Diagnosis not present

## 2017-11-29 DIAGNOSIS — Z361 Encounter for antenatal screening for raised alphafetoprotein level: Secondary | ICD-10-CM | POA: Diagnosis not present

## 2017-11-29 DIAGNOSIS — Z114 Encounter for screening for human immunodeficiency virus [HIV]: Secondary | ICD-10-CM | POA: Diagnosis not present

## 2017-11-29 DIAGNOSIS — Z118 Encounter for screening for other infectious and parasitic diseases: Secondary | ICD-10-CM | POA: Diagnosis not present

## 2017-12-06 DIAGNOSIS — Z3402 Encounter for supervision of normal first pregnancy, second trimester: Secondary | ICD-10-CM | POA: Diagnosis not present

## 2017-12-06 DIAGNOSIS — A630 Anogenital (venereal) warts: Secondary | ICD-10-CM | POA: Diagnosis not present

## 2017-12-06 DIAGNOSIS — R768 Other specified abnormal immunological findings in serum: Secondary | ICD-10-CM | POA: Diagnosis not present

## 2017-12-06 DIAGNOSIS — Z363 Encounter for antenatal screening for malformations: Secondary | ICD-10-CM | POA: Diagnosis not present

## 2018-01-03 DIAGNOSIS — Z3402 Encounter for supervision of normal first pregnancy, second trimester: Secondary | ICD-10-CM | POA: Diagnosis not present

## 2018-01-17 DIAGNOSIS — Z3402 Encounter for supervision of normal first pregnancy, second trimester: Secondary | ICD-10-CM | POA: Diagnosis not present

## 2018-02-02 ENCOUNTER — Encounter (HOSPITAL_COMMUNITY): Payer: Self-pay | Admitting: *Deleted

## 2018-02-02 ENCOUNTER — Other Ambulatory Visit: Payer: Self-pay

## 2018-02-02 ENCOUNTER — Inpatient Hospital Stay (HOSPITAL_COMMUNITY)
Admission: AD | Admit: 2018-02-02 | Discharge: 2018-02-02 | Disposition: A | Discharge status: Home / Self Care | Payer: BLUE CROSS/BLUE SHIELD | Source: Ambulatory Visit | Attending: Obstetrics and Gynecology | Admitting: Obstetrics and Gynecology

## 2018-02-02 DIAGNOSIS — R109 Unspecified abdominal pain: Secondary | ICD-10-CM | POA: Diagnosis not present

## 2018-02-02 DIAGNOSIS — Z87891 Personal history of nicotine dependence: Secondary | ICD-10-CM | POA: Diagnosis not present

## 2018-02-02 DIAGNOSIS — O26892 Other specified pregnancy related conditions, second trimester: Secondary | ICD-10-CM | POA: Diagnosis not present

## 2018-02-02 DIAGNOSIS — Z3A27 27 weeks gestation of pregnancy: Secondary | ICD-10-CM

## 2018-02-02 DIAGNOSIS — O26893 Other specified pregnancy related conditions, third trimester: Secondary | ICD-10-CM | POA: Diagnosis not present

## 2018-02-02 DIAGNOSIS — O26899 Other specified pregnancy related conditions, unspecified trimester: Secondary | ICD-10-CM

## 2018-02-02 LAB — URINALYSIS, ROUTINE W REFLEX MICROSCOPIC
Bilirubin Urine: NEGATIVE
GLUCOSE, UA: NEGATIVE mg/dL
Hgb urine dipstick: NEGATIVE
Ketones, ur: NEGATIVE mg/dL
LEUKOCYTES UA: NEGATIVE
Nitrite: NEGATIVE
PH: 6 (ref 5.0–8.0)
Protein, ur: NEGATIVE mg/dL
Specific Gravity, Urine: 1.019 (ref 1.005–1.030)

## 2018-02-02 LAB — RAPID URINE DRUG SCREEN, HOSP PERFORMED
Amphetamines: NOT DETECTED
BENZODIAZEPINES: POSITIVE — AB
Barbiturates: NOT DETECTED
Cocaine: NOT DETECTED
Opiates: NOT DETECTED
Tetrahydrocannabinol: NOT DETECTED

## 2018-02-02 MED ORDER — KETOROLAC TROMETHAMINE 30 MG/ML IJ SOLN
30.0000 mg | Freq: Once | INTRAMUSCULAR | Status: AC
  Administered 2018-02-02: 30 mg via INTRAMUSCULAR
  Filled 2018-02-02: qty 1

## 2018-02-02 MED ORDER — CYCLOBENZAPRINE HCL 5 MG PO TABS
5.0000 mg | ORAL_TABLET | Freq: Once | ORAL | Status: AC
  Administered 2018-02-02: 5 mg via ORAL
  Filled 2018-02-02: qty 1

## 2018-02-02 NOTE — Discharge Instructions (Signed)

## 2018-02-02 NOTE — MAU Provider Note (Signed)
History     CSN: [id number]  Arrival date and time: 02/02/18 1157   First Provider Initiated Contact with Patient 02/02/18 1258     Chief Complaint  Patient presents with  . Abdominal Pain   HPI Stacey Ayala is a 32 y.o. G2P0010 at [redacted]w[redacted]d who presents with left sided abdominal pain. She states this has been ongoing for a week and is cramping and sharp in nature. She states the pain shoots down her left leg. She was treated for constipation and reports regular bowel movements but the pain did not get better. She rates the pain a 10/10 and has tried tylenol with no relief. She denies any fever, nausea or vomiting. Denies dysuria. She denies any vaginal bleeding or leaking of fluid. Reports good fetal movement.   OB History    Gravida  2   Para  0   Term  0   Preterm  0   AB  1   Living  0     SAB  1   TAB  0   Ectopic  0   Multiple  0   Live Births              Past Medical History:  Diagnosis Date  . Anxiety   . Headache    Migraines  . History of hepatitis    Hep C  . Substance abuse in remission (HCC)   . Voiding difficulty    push to empty bladder    Past Surgical History:  Procedure Laterality Date  . CHROMOPERTUBATION Bilateral 05/25/2016   Procedure: CHROMOPERTUBATION/Methylene Blue Hydrotubation;  Surgeon: Genia Del, MD;  Location: [facility];  Service: Gynecology;  Laterality: Bilateral;  . LAPAROSCOPY N/A 05/25/2016   Procedure: LAPAROSCOPY DIAGNOSTIC/Lysis of Adhesion;  Surgeon: Genia Del, MD;  Location: [facility];  Service: Gynecology;  Laterality: N/A;    Family History  Problem Relation Age of Onset  . Diabetes Mother   . Hypertension Mother   . Breast cancer Paternal Grandmother   . Cancer Paternal Grandmother        melanoma  . Hypertension Paternal Grandmother     Social History   Tobacco Use  . Smoking status: Former Smoker    Packs/day: 0.50    Types: Cigarettes    Last attempt to quit: 06/09/2017    Years since  quitting: 0.6  . Smokeless tobacco: Never Used  Substance Use Topics  . Alcohol use: No  . Drug use: No    Comment: almost 2 years clean (10/11/14), relapse in 08/2017    Allergies: No Known Allergies  Medications Prior to Admission  Medication Sig Dispense Refill Last Dose  . clonazePAM (KLONOPIN) 1 MG tablet Take 1 tablet (1 mg total) by mouth 3 (three) times daily as needed for anxiety. 90 tablet 5 Taking  . Prenatal Multivit-Min-Fe-FA (PRENATAL VITAMINS) 0.8 MG tablet Take 1 tablet by mouth daily. 90 tablet 4 Taking    Review of Systems  Constitutional: Negative.  Negative for fatigue and fever.  HENT: Negative.   Respiratory: Negative.  Negative for shortness of breath.   Cardiovascular: Negative.  Negative for chest pain.  Gastrointestinal: Positive for abdominal pain. Negative for constipation, diarrhea, nausea and vomiting.  Genitourinary: Negative.  Negative for dysuria, vaginal bleeding and vaginal discharge.  Neurological: Negative.  Negative for dizziness and headaches.   Physical Exam   Blood pressure 119/66, pulse 97, temperature 98.6 F (37 C), temperature source Oral, resp. rate 18, weight 184 lb 4 oz (  83.6 kg), last menstrual period 07/28/2017, SpO2 100 %.  Physical Exam  Nursing note and vitals reviewed. Constitutional: She is oriented to person, place, and time. She appears well-developed and well-nourished. No distress.  HENT:  Head: Normocephalic.  Eyes: Pupils are equal, round, and reactive to light.  Cardiovascular: Normal rate, regular rhythm and normal heart sounds.  Respiratory: Effort normal and breath sounds normal. No respiratory distress.  GI: Soft. Bowel sounds are normal. She exhibits no distension and no mass. There is no tenderness. There is no rebound and no guarding.  Neurological: She is alert and oriented to person, place, and time.  Skin: Skin is warm and dry.  Psychiatric: She has a normal mood and affect. Her behavior is normal.  Judgment and thought content normal.    Dilation: Closed Effacement (%): Thick Cervical Position: Posterior Exam by:: Ma Hillock CNM  Fetal Tracing:  Baseline: 140 Variability: moderate Accels: 10x10  Decels: none   Toco: none   MAU Course  Procedures Results for orders placed or performed during the hospital encounter of 02/02/18 (from the past 24 hour(s))  Urinalysis, Routine w reflex microscopic     Status: Abnormal   Collection Time: 02/02/18 12:00 PM  Result Value Ref Range   Color, Urine AMBER (A) YELLOW   APPearance CLOUDY (A) CLEAR   Specific Gravity, Urine 1.019 1.005 - 1.030   pH 6.0 5.0 - 8.0   Glucose, UA NEGATIVE NEGATIVE mg/dL   Hgb urine dipstick NEGATIVE NEGATIVE   Bilirubin Urine NEGATIVE NEGATIVE   Ketones, ur NEGATIVE NEGATIVE mg/dL   Protein, ur NEGATIVE NEGATIVE mg/dL   Nitrite NEGATIVE NEGATIVE   Leukocytes, UA NEGATIVE NEGATIVE     MDM UA NST reassuring for gestational age Flexeril- no relief per patient Consulted with Dr. Billy Coast- will give patient Toradol  IM in MAU and discharge home with 48 hours of ibuprofen Toradol  IM  Patient still reporting pain. Less tearful than before but upset with plan of care. Encouraged patient to use maternity support belt and ibuprofen as discussed. Patient angry and walked out of MAU.  Assessment and Plan   1. Abdominal pain affecting pregnancy   2. [redacted] weeks gestation of pregnancy    -Discharge home in stable condition -Encouraged patient to use ibuprofen for pain relief for next 48 hours and then discontinue. Discussed with patient not use ibuprofen PRN -Preterm labor precautions discussed -Patient advised to follow-up with [facility] on Monday if no relief.  -Patient may return to MAU as needed or if her condition were to change or worsen  Rolm Bookbinder CNM 02/02/2018, 12:58 PM

## 2018-02-02 NOTE — MAU Note (Signed)
Cramping so bad.  In so much pain.  Started about a wk ago. Talked with dr, treated for constipation, - doing better  In that respect.  Cramping still getting worse.  Hx of abd surgery, uterus was  Attached to bowel- caused cramping 2017.

## 2018-02-02 NOTE — MAU Note (Signed)
Urine in lab 

## 2018-02-06 DIAGNOSIS — R1032 Left lower quadrant pain: Secondary | ICD-10-CM | POA: Diagnosis not present

## 2018-02-06 DIAGNOSIS — Z3402 Encounter for supervision of normal first pregnancy, second trimester: Secondary | ICD-10-CM | POA: Diagnosis not present

## 2018-02-06 DIAGNOSIS — Z3689 Encounter for other specified antenatal screening: Secondary | ICD-10-CM | POA: Diagnosis not present

## 2018-02-13 DIAGNOSIS — Z3689 Encounter for other specified antenatal screening: Secondary | ICD-10-CM | POA: Diagnosis not present

## 2018-02-13 DIAGNOSIS — O3663X0 Maternal care for excessive fetal growth, third trimester, not applicable or unspecified: Secondary | ICD-10-CM | POA: Diagnosis not present

## 2018-02-13 DIAGNOSIS — Z3A28 28 weeks gestation of pregnancy: Secondary | ICD-10-CM | POA: Diagnosis not present

## 2018-02-27 DIAGNOSIS — O3663X0 Maternal care for excessive fetal growth, third trimester, not applicable or unspecified: Secondary | ICD-10-CM | POA: Diagnosis not present

## 2018-02-27 DIAGNOSIS — Z3A30 30 weeks gestation of pregnancy: Secondary | ICD-10-CM | POA: Diagnosis not present

## 2018-02-27 DIAGNOSIS — N898 Other specified noninflammatory disorders of vagina: Secondary | ICD-10-CM | POA: Diagnosis not present

## 2018-03-14 DIAGNOSIS — Z23 Encounter for immunization: Secondary | ICD-10-CM | POA: Diagnosis not present

## 2018-03-14 DIAGNOSIS — O365930 Maternal care for other known or suspected poor fetal growth, third trimester, not applicable or unspecified: Secondary | ICD-10-CM | POA: Diagnosis not present

## 2018-03-14 DIAGNOSIS — Z3A32 32 weeks gestation of pregnancy: Secondary | ICD-10-CM | POA: Diagnosis not present

## 2018-03-14 DIAGNOSIS — N898 Other specified noninflammatory disorders of vagina: Secondary | ICD-10-CM | POA: Diagnosis not present

## 2018-03-26 DIAGNOSIS — O365930 Maternal care for other known or suspected poor fetal growth, third trimester, not applicable or unspecified: Secondary | ICD-10-CM | POA: Diagnosis not present

## 2018-03-26 DIAGNOSIS — Z3A34 34 weeks gestation of pregnancy: Secondary | ICD-10-CM | POA: Diagnosis not present

## 2018-03-26 DIAGNOSIS — N898 Other specified noninflammatory disorders of vagina: Secondary | ICD-10-CM | POA: Diagnosis not present

## 2018-03-28 ENCOUNTER — Inpatient Hospital Stay (HOSPITAL_COMMUNITY)
Admission: AD | Admit: 2018-03-28 | Discharge: 2018-03-28 | Disposition: A | Discharge status: Home / Self Care | Payer: BLUE CROSS/BLUE SHIELD | Source: Ambulatory Visit | Attending: Obstetrics and Gynecology | Admitting: Obstetrics and Gynecology

## 2018-03-28 ENCOUNTER — Encounter (HOSPITAL_COMMUNITY): Payer: Self-pay | Admitting: *Deleted

## 2018-03-28 DIAGNOSIS — O99333 Smoking (tobacco) complicating pregnancy, third trimester: Secondary | ICD-10-CM | POA: Diagnosis not present

## 2018-03-28 DIAGNOSIS — Z3A34 34 weeks gestation of pregnancy: Secondary | ICD-10-CM | POA: Insufficient documentation

## 2018-03-28 DIAGNOSIS — O9989 Other specified diseases and conditions complicating pregnancy, childbirth and the puerperium: Secondary | ICD-10-CM

## 2018-03-28 DIAGNOSIS — J069 Acute upper respiratory infection, unspecified: Secondary | ICD-10-CM | POA: Insufficient documentation

## 2018-03-28 DIAGNOSIS — F17210 Nicotine dependence, cigarettes, uncomplicated: Secondary | ICD-10-CM | POA: Insufficient documentation

## 2018-03-28 DIAGNOSIS — O4703 False labor before 37 completed weeks of gestation, third trimester: Secondary | ICD-10-CM

## 2018-03-28 DIAGNOSIS — O99891 Other specified diseases and conditions complicating pregnancy: Secondary | ICD-10-CM

## 2018-03-28 DIAGNOSIS — O479 False labor, unspecified: Secondary | ICD-10-CM

## 2018-03-28 DIAGNOSIS — J029 Acute pharyngitis, unspecified: Secondary | ICD-10-CM

## 2018-03-28 DIAGNOSIS — M549 Dorsalgia, unspecified: Secondary | ICD-10-CM

## 2018-03-28 DIAGNOSIS — O4700 False labor before 37 completed weeks of gestation, unspecified trimester: Secondary | ICD-10-CM

## 2018-03-28 DIAGNOSIS — O99513 Diseases of the respiratory system complicating pregnancy, third trimester: Secondary | ICD-10-CM | POA: Insufficient documentation

## 2018-03-28 DIAGNOSIS — R0602 Shortness of breath: Secondary | ICD-10-CM | POA: Diagnosis present

## 2018-03-28 HISTORY — DX: Inflammatory liver disease, unspecified: K75.9

## 2018-03-28 MED ORDER — AMOXICILLIN 500 MG PO CAPS
500.0000 mg | ORAL_CAPSULE | Freq: Once | ORAL | Status: AC
  Administered 2018-03-28: 500 mg via ORAL
  Filled 2018-03-28: qty 1

## 2018-03-28 MED ORDER — PHENOL 1.4 % MT LIQD
1.0000 | OROMUCOSAL | Status: DC | PRN
  Administered 2018-03-28: 1 via OROMUCOSAL
  Filled 2018-03-28: qty 177

## 2018-03-28 MED ORDER — AMOXICILLIN 500 MG PO CAPS: 500.0000 mg | ORAL_CAPSULE | Freq: Three times a day (TID) | ORAL | 2 refills | Status: DC

## 2018-03-28 MED ORDER — GUAIFENESIN ER 600 MG PO TB12: 600.0000 mg | ORAL_TABLET | Freq: Two times a day (BID) | ORAL | 0 refills | Status: DC

## 2018-03-28 MED ORDER — TERCONAZOLE 0.4 % VA CREA: 1.0000 | TOPICAL_CREAM | Freq: Every day | VAGINAL | 0 refills | Status: DC

## 2018-03-28 MED ORDER — CYCLOBENZAPRINE HCL 5 MG PO TABS
5.0000 mg | ORAL_TABLET | Freq: Once | ORAL | Status: AC
  Administered 2018-03-28: 5 mg via ORAL
  Filled 2018-03-28: qty 1

## 2018-03-28 NOTE — MAU Provider Note (Signed)
Chief Complaint:  Shortness of Breath and Sore Throat   First Provider Initiated Contact with Patient 03/28/18 0417     HPI: Stacey Ayala is a 32 y.o. G2P0010 at 31w5dwho presents to maternity admissions reporting severe sore throat, nasal congestion, pain in upper back and shoulders.  Very demonstrative when describing pain at "15-17 out of 10".  Asks several times about using Tylenol #3 for pain and codeine syrup.  States is in recovery and does not want to use anything unless the doctor tells her to.  Tried Tylenol at 11pm with no relief. She reports good fetal movement, denies LOF, vaginal bleeding, vaginal itching/burning, urinary symptoms, h/a, dizziness, n/v, diarrhea, constipation or fever/chills. .  Shortness of Breath  This is a new problem. The current episode started yesterday. The problem occurs intermittently. Progression since onset: States not really short of breath, but chest is tight when she coughs. Associated symptoms include a sore throat. Pertinent negatives include no abdominal pain, ear pain, fever, headaches, leg pain, leg swelling, neck pain, rhinorrhea, sputum production, syncope, vomiting or wheezing. The patient has no known risk factors for DVT/PE. She has tried nothing for the symptoms.  Sore Throat   This is a new problem. The current episode started yesterday. The problem has been unchanged. Neither side of throat is experiencing more pain than the other. There has been no fever. The pain is severe. Associated symptoms include coughing, diarrhea (States thinks it is the dozens of cough drops and green tea), shortness of breath and trouble swallowing. Pertinent negatives include no abdominal pain, ear pain, headaches, neck pain or vomiting. She has tried acetaminophen for the symptoms. The treatment provided no relief.    RN Note: Pt reports sore throat and cough that came on yesterday evening. Pt reports that her head, back and shoulders hurt. Pain 15/10    Past  Medical History: Past Medical History:  Diagnosis Date  . Anxiety   . Headache    Migraines  . Hepatitis   . History of hepatitis    Hep C  . Substance abuse in remission (HCC)   . Voiding difficulty    push to empty bladder    Past obstetric history: OB History  Gravida Para Term Preterm AB Living  2 0 0 0 1 0  SAB TAB Ectopic Multiple Live Births  1 0 0 0      # Outcome Date GA Lbr Len/2nd Weight Sex Delivery Anes PTL Lv  2 Current           1 SAB 03/2017            Past Surgical History: Past Surgical History:  Procedure Laterality Date  . CHROMOPERTUBATION Bilateral 05/25/2016   Procedure: CHROMOPERTUBATION/Methylene Blue Hydrotubation;  Surgeon: Genia DelMarie-Lyne Lavoie, MD;  Location: [facility];  Service: Gynecology;  Laterality: Bilateral;  . LAPAROSCOPY N/A 05/25/2016   Procedure: LAPAROSCOPY DIAGNOSTIC/Lysis of Adhesion;  Surgeon: Genia DelMarie-Lyne Lavoie, MD;  Location: [facility];  Service: Gynecology;  Laterality: N/A;    Family History: Family History  Problem Relation Age of Onset  . Diabetes Mother   . Hypertension Mother   . Breast cancer Paternal Grandmother   . Cancer Paternal Grandmother        melanoma  . Hypertension Paternal Grandmother     Social History: Social History   Tobacco Use  . Smoking status: Current Every Day Smoker    Packs/day: 0.50    Types: Cigarettes    Last attempt to quit: 06/09/2017  Years since quitting: 0.8  . Smokeless tobacco: Never Used  Substance Use Topics  . Alcohol use: No  . Drug use: No    Comment: almost 2 years clean (10/11/14), relapse in 08/2017    Allergies: No Known Allergies  Meds:  No medications prior to admission.    I have reviewed patient's Past Medical Hx, Surgical Hx, Family Hx, Social Hx, medications and allergies.   ROS:  Review of Systems  Constitutional: Negative for fever.  HENT: Positive for sore throat and trouble swallowing. Negative for ear pain and rhinorrhea.   Respiratory: Positive for  cough and shortness of breath. Negative for sputum production and wheezing.   Cardiovascular: Negative for leg swelling and syncope.  Gastrointestinal: Positive for diarrhea (States thinks it is the dozens of cough drops and green tea). Negative for abdominal pain and vomiting.  Musculoskeletal: Negative for neck pain.  Neurological: Negative for headaches.   Other systems negative  Physical Exam   Patient Vitals for the past 24 hrs:  BP Temp Temp src Pulse Resp SpO2 Height Weight  03/28/18 0525 130/65 - - 89 18 - - -  03/28/18 0346 124/65 98.2 F (36.8 C) Oral (!) 105 20 99 % 5\' 10"  (1.778 m) 197 lb (89.4 kg)   Constitutional: Well-developed, well-nourished female in no acute distress.  Cardiovascular: normal rate and rhythm Respiratory: normal effort, clear to auscultation bilaterally GI: Abd soft, non-tender, gravid appropriate for gestational age.   No rebound or guarding. MS: Extremities nontender, no edema, normal ROM Neurologic: Alert and oriented x 4.  GU: Neg CVAT.  PELVIC EXAM:  Dilation: Closed Effacement (%): 70 Cervical Position: Middle Exam by:: Artelia Laroche CNM  FHT:  Baseline 145 , moderate variability, accelerations present, no decelerations Contractions: q 2-3 mins Irregular   Patient is not aware of UCs at all.    Labs:   No results found for this or any previous visit (from the past 24 hour(s)). Strep pending  Imaging:  No results found.  MAU Course/MDM: I have ordered labs and reviewed results.  NST reviewed and is reactive.  Cervix effaced but not open. Pt does not feel contractions Consult Dr Juliene Pina with presentation, exam findings and test results.  Treatments in MAU included Chloraseptic spray, single dose of 31mg  Flexeril for back pain..   Discussed antibiotics to treat throat Discussed narcotics unlikely to help sore throat much. Tylenol and spray Rx mucinex for URI Rx amoxicillin for pharyngitis Rx Terazol for possible yeast  infection  Assessment: 1. Pharyngitis, unspecified etiology   2. Viral upper respiratory tract infection   3. Back pain affecting pregnancy in third trimester   4. Preterm uterine contractions     Plan: Discharge home Labor precautions and fetal kick counts Follow up in Office for prenatal visits and recheck of cervix Supportive care Strep pending Push fluids Rx amoxicillin for pharygitis Rx Mucinex for congestion Rx Terazol prn yeast Has bottle of Chloraseptic spray  Follow-up Information    Olivia Mackie, MD. Schedule an appointment as soon as possible for a visit.   Specialty:  Obstetrics and Gynecology Contact information: [address] [place] [address] [phone]           Pt stable at time of discharge.  Wynelle Bourgeois CNM, MSN Certified Nurse-Midwife 03/28/2018 6:15 AM

## 2018-03-28 NOTE — Discharge Instructions (Signed)

## 2018-03-28 NOTE — MAU Note (Signed)
Pt reports sore throat and cough that came on yesterday evening. Pt reports that her head, back and shoulders hurt. Pain 15/10

## 2018-03-30 LAB — CULTURE, GROUP A STREP (THRC)

## 2018-04-02 DIAGNOSIS — Z3685 Encounter for antenatal screening for Streptococcus B: Secondary | ICD-10-CM | POA: Diagnosis not present

## 2018-04-02 DIAGNOSIS — J069 Acute upper respiratory infection, unspecified: Secondary | ICD-10-CM | POA: Diagnosis not present

## 2018-04-02 LAB — OB RESULTS CONSOLE GBS: GBS: POSITIVE

## 2018-04-12 DIAGNOSIS — O365930 Maternal care for other known or suspected poor fetal growth, third trimester, not applicable or unspecified: Secondary | ICD-10-CM | POA: Diagnosis not present

## 2018-04-12 DIAGNOSIS — Z3A36 36 weeks gestation of pregnancy: Secondary | ICD-10-CM | POA: Diagnosis not present

## 2018-04-18 DIAGNOSIS — O365930 Maternal care for other known or suspected poor fetal growth, third trimester, not applicable or unspecified: Secondary | ICD-10-CM | POA: Diagnosis not present

## 2018-04-18 DIAGNOSIS — Z3A37 37 weeks gestation of pregnancy: Secondary | ICD-10-CM | POA: Diagnosis not present

## 2018-04-23 DIAGNOSIS — O3663X0 Maternal care for excessive fetal growth, third trimester, not applicable or unspecified: Secondary | ICD-10-CM | POA: Diagnosis not present

## 2018-04-23 DIAGNOSIS — Z3A38 38 weeks gestation of pregnancy: Secondary | ICD-10-CM | POA: Diagnosis not present

## 2018-04-30 ENCOUNTER — Telehealth (HOSPITAL_COMMUNITY): Payer: Self-pay | Admitting: *Deleted

## 2018-04-30 ENCOUNTER — Encounter (HOSPITAL_COMMUNITY): Payer: Self-pay | Admitting: *Deleted

## 2018-04-30 NOTE — Telephone Encounter (Signed)
Preadmission screen  

## 2018-05-01 DIAGNOSIS — O3663X0 Maternal care for excessive fetal growth, third trimester, not applicable or unspecified: Secondary | ICD-10-CM | POA: Diagnosis not present

## 2018-05-01 DIAGNOSIS — Z3A39 39 weeks gestation of pregnancy: Secondary | ICD-10-CM | POA: Diagnosis not present

## 2018-05-03 ENCOUNTER — Other Ambulatory Visit: Payer: Self-pay | Admitting: Obstetrics and Gynecology

## 2018-05-06 ENCOUNTER — Inpatient Hospital Stay (HOSPITAL_COMMUNITY): Admission: RE | Admit: 2018-05-06 | Payer: BLUE CROSS/BLUE SHIELD | Source: Ambulatory Visit

## 2018-05-07 ENCOUNTER — Encounter (HOSPITAL_COMMUNITY): Payer: Self-pay

## 2018-05-07 ENCOUNTER — Inpatient Hospital Stay (HOSPITAL_COMMUNITY): Payer: BLUE CROSS/BLUE SHIELD | Admitting: Anesthesiology

## 2018-05-07 ENCOUNTER — Inpatient Hospital Stay (HOSPITAL_COMMUNITY)
Admission: RE | Admit: 2018-05-07 | Discharge: 2018-05-09 | DRG: 806 | Disposition: A | Discharge status: Home / Self Care | Payer: BLUE CROSS/BLUE SHIELD | Attending: Obstetrics and Gynecology | Admitting: Obstetrics and Gynecology

## 2018-05-07 DIAGNOSIS — O480 Post-term pregnancy: Secondary | ICD-10-CM | POA: Diagnosis not present

## 2018-05-07 DIAGNOSIS — Z3490 Encounter for supervision of normal pregnancy, unspecified, unspecified trimester: Secondary | ICD-10-CM | POA: Diagnosis present

## 2018-05-07 DIAGNOSIS — Z23 Encounter for immunization: Secondary | ICD-10-CM | POA: Diagnosis not present

## 2018-05-07 DIAGNOSIS — B182 Chronic viral hepatitis C: Secondary | ICD-10-CM | POA: Diagnosis present

## 2018-05-07 DIAGNOSIS — O99334 Smoking (tobacco) complicating childbirth: Secondary | ICD-10-CM | POA: Diagnosis not present

## 2018-05-07 DIAGNOSIS — F17210 Nicotine dependence, cigarettes, uncomplicated: Secondary | ICD-10-CM | POA: Diagnosis present

## 2018-05-07 DIAGNOSIS — Z3A40 40 weeks gestation of pregnancy: Secondary | ICD-10-CM

## 2018-05-07 DIAGNOSIS — O9842 Viral hepatitis complicating childbirth: Secondary | ICD-10-CM | POA: Diagnosis present

## 2018-05-07 DIAGNOSIS — O99824 Streptococcus B carrier state complicating childbirth: Secondary | ICD-10-CM | POA: Diagnosis present

## 2018-05-07 DIAGNOSIS — Z412 Encounter for routine and ritual male circumcision: Secondary | ICD-10-CM | POA: Diagnosis not present

## 2018-05-07 LAB — TYPE AND SCREEN
ABO/RH(D): O POS
Antibody Screen: NEGATIVE

## 2018-05-07 LAB — CBC
HEMATOCRIT: 38.6 % (ref 36.0–46.0)
HEMOGLOBIN: 13.3 g/dL (ref 12.0–15.0)
MCH: 30.8 pg (ref 26.0–34.0)
MCHC: 34.5 g/dL (ref 30.0–36.0)
MCV: 89.4 fL (ref 78.0–100.0)
Platelets: 329 10*3/uL (ref 150–400)
RBC: 4.32 MIL/uL (ref 3.87–5.11)
RDW: 14 % (ref 11.5–15.5)
WBC: 14.5 10*3/uL — ABNORMAL HIGH (ref 4.0–10.5)

## 2018-05-07 LAB — ABO/RH: ABO/RH(D): O POS

## 2018-05-07 MED ORDER — SOD CITRATE-CITRIC ACID 500-334 MG/5ML PO SOLN: 30.0000 mL | ORAL | Status: DC | PRN

## 2018-05-07 MED ORDER — SODIUM CHLORIDE 0.9 % IV SOLN
5.0000 10*6.[IU] | Freq: Once | INTRAVENOUS | Status: AC
  Administered 2018-05-07: 5 10*6.[IU] via INTRAVENOUS
  Filled 2018-05-07 (×2): qty 5

## 2018-05-07 MED ORDER — FENTANYL CITRATE (PF) 100 MCG/2ML IJ SOLN
INTRAMUSCULAR | Status: AC
  Administered 2018-05-07: 100 ug
  Filled 2018-05-07: qty 2

## 2018-05-07 MED ORDER — PENICILLIN G POT IN DEXTROSE 60000 UNIT/ML IV SOLN
3.0000 10*6.[IU] | INTRAVENOUS | Status: DC
  Administered 2018-05-07 – 2018-05-08 (×3): 3 10*6.[IU] via INTRAVENOUS
  Filled 2018-05-07 (×7): qty 50

## 2018-05-07 MED ORDER — LACTATED RINGERS IV SOLN: 500.0000 mL | INTRAVENOUS | Status: DC | PRN

## 2018-05-07 MED ORDER — BUTORPHANOL TARTRATE 1 MG/ML IJ SOLN
INTRAMUSCULAR | Status: AC
  Filled 2018-05-07: qty 2

## 2018-05-07 MED ORDER — ACETAMINOPHEN 325 MG PO TABS: 650.0000 mg | ORAL_TABLET | ORAL | Status: DC | PRN

## 2018-05-07 MED ORDER — PHENYLEPHRINE 40 MCG/ML (10ML) SYRINGE FOR IV PUSH (FOR BLOOD PRESSURE SUPPORT)
80.0000 ug | PREFILLED_SYRINGE | INTRAVENOUS | Status: DC | PRN
  Filled 2018-05-07: qty 10
  Filled 2018-05-07: qty 5

## 2018-05-07 MED ORDER — FENTANYL 2.5 MCG/ML BUPIVACAINE 1/10 % EPIDURAL INFUSION (WH - ANES)
14.0000 mL/h | INTRAMUSCULAR | Status: DC | PRN
  Administered 2018-05-07: 14 mL/h via EPIDURAL
  Filled 2018-05-07: qty 100

## 2018-05-07 MED ORDER — TERBUTALINE SULFATE 1 MG/ML IJ SOLN
0.2500 mg | Freq: Once | INTRAMUSCULAR | Status: DC | PRN
  Filled 2018-05-07: qty 1

## 2018-05-07 MED ORDER — EPHEDRINE 5 MG/ML INJ
10.0000 mg | INTRAVENOUS | Status: DC | PRN
  Filled 2018-05-07: qty 2

## 2018-05-07 MED ORDER — LIDOCAINE HCL (PF) 1 % IJ SOLN
INTRAMUSCULAR | Status: DC | PRN
  Administered 2018-05-07 (×2): 6 mL via EPIDURAL

## 2018-05-07 MED ORDER — ONDANSETRON HCL 4 MG/2ML IJ SOLN
4.0000 mg | Freq: Four times a day (QID) | INTRAMUSCULAR | Status: DC | PRN
  Administered 2018-05-08: 4 mg via INTRAVENOUS
  Filled 2018-05-07: qty 2

## 2018-05-07 MED ORDER — OXYTOCIN 40 UNITS IN LACTATED RINGERS INFUSION - SIMPLE MED
1.0000 m[IU]/min | INTRAVENOUS | Status: DC
  Administered 2018-05-08: 1 m[IU]/min via INTRAVENOUS
  Filled 2018-05-07: qty 1000

## 2018-05-07 MED ORDER — MISOPROSTOL 25 MCG QUARTER TABLET
25.0000 ug | ORAL_TABLET | ORAL | Status: DC | PRN
  Administered 2018-05-07 (×3): 25 ug via VAGINAL
  Filled 2018-05-07: qty 1

## 2018-05-07 MED ORDER — DIPHENHYDRAMINE HCL 50 MG/ML IJ SOLN: 12.5000 mg | INTRAMUSCULAR | Status: DC | PRN

## 2018-05-07 MED ORDER — LACTATED RINGERS IV SOLN: 500.0000 mL | Freq: Once | INTRAVENOUS | Status: DC

## 2018-05-07 MED ORDER — BUTORPHANOL TARTRATE 1 MG/ML IJ SOLN
2.0000 mg | Freq: Once | INTRAMUSCULAR | Status: AC
  Administered 2018-05-07: 2 mg via INTRAVENOUS

## 2018-05-07 MED ORDER — SIMETHICONE 80 MG PO CHEW: 80.0000 mg | CHEWABLE_TABLET | Freq: Once | ORAL | Status: DC

## 2018-05-07 MED ORDER — PHENYLEPHRINE 40 MCG/ML (10ML) SYRINGE FOR IV PUSH (FOR BLOOD PRESSURE SUPPORT)
80.0000 ug | PREFILLED_SYRINGE | INTRAVENOUS | Status: DC | PRN
  Filled 2018-05-07: qty 5

## 2018-05-07 MED ORDER — MISOPROSTOL 25 MCG QUARTER TABLET
25.0000 ug | ORAL_TABLET | ORAL | Status: DC | PRN
Start: 2018-05-07 — End: 2018-05-08
  Filled 2018-05-07 (×4): qty 1

## 2018-05-07 MED ORDER — LACTATED RINGERS IV SOLN
INTRAVENOUS | Status: DC
  Administered 2018-05-07: 09:00:00 via INTRAVENOUS

## 2018-05-07 NOTE — Anesthesia Procedure Notes (Signed)
Epidural Patient location during procedure: OB Start time: 05/07/2018 11:30 PM End time: 05/07/2018 11:34 PM  Staffing Anesthesiologist: Leilani AbleHatchett, Eliberto Sole, MD Performed: anesthesiologist   Preanesthetic Checklist Completed: patient identified, site marked, surgical consent, pre-op evaluation, timeout performed, IV checked, risks and benefits discussed and monitors and equipment checked  Epidural Patient position: sitting Prep: site prepped and draped and DuraPrep Patient monitoring: continuous pulse ox and blood pressure Approach: midline Location: L3-L4 Injection technique: LOR air  Needle:  Needle type: Tuohy  Needle gauge: 17 G Needle length: 9 cm and 9 Needle insertion depth: 6 cm Catheter type: closed end flexible Catheter size: 19 Gauge Catheter at skin depth: 11 cm Test dose: negative and Other  Assessment Sensory level: T9 Events: blood not aspirated, injection not painful, no injection resistance, negative IV test and no paresthesia

## 2018-05-07 NOTE — H&P (Signed)
Stacey Ayala is a 32 y.o. female presenting for IOL for history of substance abuse in remission and hep c carrier with neg RNA titers now postdates.. OB History    Gravida  2   Para  0   Term  0   Preterm  0   AB  1   Living  0     SAB  1   TAB  0   Ectopic  0   Multiple  0   Live Births             Past Medical History:  Diagnosis Date  . Anxiety   . Condyloma   . Headache    Migraines  . Hepatitis   . History of endometriosis   . History of hepatitis    Hep C  . Substance abuse in remission (HCC)   . Voiding difficulty    push to empty bladder   Past Surgical History:  Procedure Laterality Date  . CHROMOPERTUBATION Bilateral 05/25/2016   Procedure: CHROMOPERTUBATION/Methylene Blue Hydrotubation;  Surgeon: Genia Del, MD;  Location: [facility];  Service: Gynecology;  Laterality: Bilateral;  . LAPAROSCOPY N/A 05/25/2016   Procedure: LAPAROSCOPY DIAGNOSTIC/Lysis of Adhesion;  Surgeon: Genia Del, MD;  Location: [facility];  Service: Gynecology;  Laterality: N/A;   Family History: family history includes Breast cancer in her paternal grandmother; Cancer in her paternal grandmother; Diabetes in her mother; Hypertension in her mother, paternal grandfather, and paternal grandmother. Social History:  reports that she has been smoking cigarettes.  She has been smoking about 0.50 packs per day. She has never used smokeless tobacco. She reports that she does not drink alcohol or use drugs.     Maternal Diabetes: No Genetic Screening: Normal Maternal Ultrasounds/Referrals: Normal Fetal Ultrasounds or other Referrals:  None Maternal Substance Abuse:  Yes:  Type: Other: smoker and klonopin Significant Maternal Medications:  None Significant Maternal Lab Results:  Lab values include: Group B Strep positive Other Comments:  pos hep c carrier and neg RNA  Review of Systems  Constitutional: Negative.   All other systems reviewed and are negative.  Maternal  Medical History:  Fetal activity: Perceived fetal activity is normal.   Last perceived fetal movement was within the past hour.    Prenatal complications: Substance abuse.   Prenatal Complications - Diabetes: none.      Blood pressure 128/76, pulse (!) 124, temperature 98.2 F (36.8 C), height 5\' 9"  (1.753 m), weight 93 kg (205 lb), last menstrual period 07/28/2017. Maternal Exam:  Uterine Assessment: Contraction strength is mild.  Contraction frequency is irregular.   Abdomen: Patient reports no abdominal tenderness. Fetal presentation: vertex  Introitus: Normal vulva. Normal vagina.  Ferning test: not done.  Nitrazine test: not done. Amniotic fluid character: not assessed.  Pelvis: adequate for delivery.   Cervix: Cervix evaluated by digital exam.     Physical Exam  Nursing note and vitals reviewed. Constitutional: She is oriented to person, place, and time. She appears well-developed and well-nourished.  Neck: Normal range of motion. Neck supple.  Cardiovascular: Normal rate and regular rhythm.  Respiratory: Breath sounds normal.  GI: Soft. Bowel sounds are normal.  Genitourinary: Vagina normal and uterus normal.  Musculoskeletal: Normal range of motion.  Neurological: She is alert and oriented to person, place, and time. She has normal reflexes.  Skin: Skin is warm and dry.  Psychiatric: She has a normal mood and affect.    Prenatal labs: ABO, Rh: --/--/O POS (08/06 0900) Antibody: NEG (  08/06 0900) Rubella: Nonimmune (01/04 0000) RPR: Nonreactive (01/04 0000)  HBsAg: Negative (01/04 0000)  HIV: Non-reactive (01/04 0000)  GBS: Positive (07/02 0000)   Assessment/Plan: Postdates IUP Substance abuse in remission Smoker Chronic HCV with nl lfts and new HCV RNA GBS pos IOL   Stacey Ayala J 05/07/2018, 12:21 PM

## 2018-05-07 NOTE — Progress Notes (Signed)
Tildon HuskyBrittany Montellano is a 32 y.o. G2P0010 at 692w3d by LMP admitted for postdates and history of HCV  Subjective: Crampy Good FM  Objective: BP 128/76   Pulse (!) 124   Temp 98.2 F (36.8 C)   Ht 5\' 9"  (1.753 m)   Wt 93 kg (205 lb)   LMP 07/28/2017   BMI 30.27 kg/m  No intake/output data recorded. No intake/output data recorded.  FHT:  FHR: 145 bpm, variability: moderate,  accelerations:  Present,  decelerations:  Absent UC:   irregular, every 1-5 minutes SVE:   Dilation: 1 Effacement (%): 70 Station: -2 Exam by:: Kayliegh Boyers  Labs: Lab Results  Component Value Date   WBC 14.5 (H) 05/07/2018   HGB 13.3 05/07/2018   HCT 38.6 05/07/2018   MCV 89.4 05/07/2018   PLT 329 05/07/2018    Assessment / Plan: Postdates IOL  HCV with neg RNA GBS pos  Labor: Progressing normally Preeclampsia:  no signs or symptoms of toxicity Fetal Wellbeing:  Category I Pain Control:  Labor support without medications I/D:  n/a Anticipated MOD:  NSVD  Yuan Gann J 05/07/2018, 5:10 PM

## 2018-05-07 NOTE — Progress Notes (Signed)
Stacey Ayala is a 32 y.o. G2P0010 at 2227w3d by LMP admitted for induction of labor due to Post dates..  Subjective: Good pain relief  Objective: BP 126/83   Pulse 77   Temp 97.7 F (36.5 C)   Resp 16   Ht 5\' 9"  (1.753 m)   Wt 93 kg (205 lb)   LMP 07/28/2017   BMI 30.27 kg/m  No intake/output data recorded. No intake/output data recorded.  FHT:  FHR: 125 bpm, variability: moderate,  accelerations:  Present,  decelerations:  Absent UC:   irregular, every 1-6 minutes SVE:   Dilation: 1 Effacement (%): 80 Station: -1 Exam by:: m wilkins rnc  Labs: Lab Results  Component Value Date   WBC 14.5 (H) 05/07/2018   HGB 13.3 05/07/2018   HCT 38.6 05/07/2018   MCV 89.4 05/07/2018   PLT 329 05/07/2018    Assessment / Plan: Postdates IUP Hep C positive- neg RNA Substance abuse in remission  Labor: Progressing normally Preeclampsia:  no signs or symptoms of toxicity Fetal Wellbeing:  Category I Pain Control:  IV pain meds I/D:  n/a Anticipated MOD:  NSVD  Stacey Ayala J 05/07/2018, 8:45 PM

## 2018-05-07 NOTE — Anesthesia Pain Management Evaluation Note (Signed)
  CRNA Pain Management Visit Note  Patient: Stacey Ayala, 32 y.o., female  "Hello [facility] am a [facility]. We have an anesthesia team available at all times to provide care throughout the hospital, including epidural management and anesthesia for C-section. I don't know your plan for the delivery whether it a natural birth, water birth, IV sedation, nitrous supplementation, doula or epidural, but we want to meet your pain goals."   1.Was your pain managed to your expectations on prior hospitalizations?   No prior hospitalizations  2.What is your expectation for pain management during this hospitalization?     Epidural  3.How can we help you reach that goal? Epidural when ready  Record the patient's initial score and the patient's pain goal.   Pain: 1  Pain Goal: unable to say at this time [facility] wants you to be able to say your pain was always managed very well.  Edison PaceWILKERSON,Michiah Masse 05/07/2018

## 2018-05-07 NOTE — Anesthesia Preprocedure Evaluation (Signed)
Anesthesia Evaluation  Patient identified by MRN, date of birth, ID band Patient awake    Reviewed: Allergy & Precautions, H&P , NPO status , Patient's Chart, lab work & pertinent test results  Airway Mallampati: I  TM Distance: >3 FB Neck ROM: full    Dental no notable dental hx. (+) Teeth Intact   Pulmonary Current Smoker,    Pulmonary exam normal breath sounds clear to auscultation       Cardiovascular negative cardio ROS Normal cardiovascular exam Rhythm:regular Rate:Normal     Neuro/Psych PSYCHIATRIC DISORDERS Anxiety    GI/Hepatic negative GI ROS,   Endo/Other  negative endocrine ROS  Renal/GU negative Renal ROS     Musculoskeletal negative musculoskeletal ROS (+)   Abdominal Normal abdominal exam  (+)   Peds  Hematology negative hematology ROS (+)   Anesthesia Other Findings   Reproductive/Obstetrics (+) Pregnancy                             Anesthesia Physical Anesthesia Plan  ASA: II  Anesthesia Plan: Epidural   Post-op Pain Management:    Induction:   PONV Risk Score and Plan:   Airway Management Planned:   Additional Equipment:   Intra-op Plan:   Post-operative Plan:   Informed Consent: I have reviewed the patients History and Physical, chart, labs and discussed the procedure including the risks, benefits and alternatives for the proposed anesthesia with the patient or authorized representative who has indicated his/her understanding and acceptance.     Plan Discussed with:   Anesthesia Plan Comments:         Anesthesia Quick Evaluation

## 2018-05-08 ENCOUNTER — Encounter (HOSPITAL_COMMUNITY): Payer: Self-pay

## 2018-05-08 LAB — CBC
HCT: 32.1 % — ABNORMAL LOW (ref 36.0–46.0)
HEMOGLOBIN: 11.1 g/dL — AB (ref 12.0–15.0)
MCH: 30.9 pg (ref 26.0–34.0)
MCHC: 34.6 g/dL (ref 30.0–36.0)
MCV: 89.4 fL (ref 78.0–100.0)
Platelets: 265 10*3/uL (ref 150–400)
RBC: 3.59 MIL/uL — AB (ref 3.87–5.11)
RDW: 14.1 % (ref 11.5–15.5)
WBC: 22.1 10*3/uL — ABNORMAL HIGH (ref 4.0–10.5)

## 2018-05-08 LAB — RPR: RPR: NONREACTIVE

## 2018-05-08 MED ORDER — DIPHENHYDRAMINE HCL 25 MG PO CAPS: 25.0000 mg | ORAL_CAPSULE | Freq: Four times a day (QID) | ORAL | Status: DC | PRN

## 2018-05-08 MED ORDER — BENZOCAINE-MENTHOL 20-0.5 % EX AERO
1.0000 "application " | INHALATION_SPRAY | CUTANEOUS | Status: DC | PRN
  Administered 2018-05-08: 1 via TOPICAL
  Filled 2018-05-08: qty 56

## 2018-05-08 MED ORDER — METHYLERGONOVINE MALEATE 0.2 MG PO TABS: 0.2000 mg | ORAL_TABLET | ORAL | Status: DC | PRN

## 2018-05-08 MED ORDER — SIMETHICONE 80 MG PO CHEW: 80.0000 mg | CHEWABLE_TABLET | ORAL | Status: DC | PRN

## 2018-05-08 MED ORDER — ONDANSETRON HCL 4 MG/2ML IJ SOLN: 4.0000 mg | INTRAMUSCULAR | Status: DC | PRN

## 2018-05-08 MED ORDER — OXYTOCIN BOLUS FROM INFUSION
500.0000 mL | Freq: Once | INTRAVENOUS | Status: AC
  Administered 2018-05-08: 500 mL via INTRAVENOUS

## 2018-05-08 MED ORDER — COCONUT OIL OIL: 1.0000 "application " | TOPICAL_OIL | Status: DC | PRN

## 2018-05-08 MED ORDER — ACETAMINOPHEN 325 MG PO TABS
650.0000 mg | ORAL_TABLET | Freq: Four times a day (QID) | ORAL | Status: DC
  Filled 2018-05-08: qty 2

## 2018-05-08 MED ORDER — OXYCODONE-ACETAMINOPHEN 5-325 MG PO TABS
1.0000 | ORAL_TABLET | ORAL | Status: DC | PRN
  Administered 2018-05-08 – 2018-05-09 (×4): 2 via ORAL
  Administered 2018-05-09: 1 via ORAL
  Filled 2018-05-08 (×2): qty 2
  Filled 2018-05-08: qty 1
  Filled 2018-05-08 (×3): qty 2

## 2018-05-08 MED ORDER — OXYTOCIN 40 UNITS IN LACTATED RINGERS INFUSION - SIMPLE MED: 2.5000 [IU]/h | INTRAVENOUS | Status: DC

## 2018-05-08 MED ORDER — DIBUCAINE 1 % RE OINT: 1.0000 "application " | TOPICAL_OINTMENT | RECTAL | Status: DC | PRN

## 2018-05-08 MED ORDER — CLONAZEPAM 0.5 MG PO TABS
1.0000 mg | ORAL_TABLET | Freq: Three times a day (TID) | ORAL | Status: DC | PRN
  Administered 2018-05-08 – 2018-05-09 (×2): 1 mg via ORAL
  Filled 2018-05-08 (×4): qty 2

## 2018-05-08 MED ORDER — METHYLERGONOVINE MALEATE 0.2 MG/ML IJ SOLN: 0.2000 mg | INTRAMUSCULAR | Status: DC | PRN

## 2018-05-08 MED ORDER — SENNOSIDES-DOCUSATE SODIUM 8.6-50 MG PO TABS
2.0000 | ORAL_TABLET | ORAL | Status: DC
  Filled 2018-05-08: qty 2

## 2018-05-08 MED ORDER — ZOLPIDEM TARTRATE 5 MG PO TABS: 5.0000 mg | ORAL_TABLET | Freq: Every evening | ORAL | Status: DC | PRN

## 2018-05-08 MED ORDER — ACETAMINOPHEN 325 MG PO TABS
650.0000 mg | ORAL_TABLET | ORAL | Status: DC | PRN
  Filled 2018-05-08: qty 2

## 2018-05-08 MED ORDER — IBUPROFEN 600 MG PO TABS
600.0000 mg | ORAL_TABLET | Freq: Four times a day (QID) | ORAL | Status: DC
  Administered 2018-05-08: 600 mg via ORAL
  Filled 2018-05-08: qty 1

## 2018-05-08 MED ORDER — PRENATAL MULTIVITAMIN CH
1.0000 | ORAL_TABLET | Freq: Every day | ORAL | Status: DC
  Filled 2018-05-08: qty 1

## 2018-05-08 MED ORDER — IBUPROFEN 800 MG PO TABS
800.0000 mg | ORAL_TABLET | Freq: Three times a day (TID) | ORAL | Status: DC
  Administered 2018-05-08 – 2018-05-09 (×2): 800 mg via ORAL
  Filled 2018-05-08 (×3): qty 1

## 2018-05-08 MED ORDER — TETANUS-DIPHTH-ACELL PERTUSSIS 5-2.5-18.5 LF-MCG/0.5 IM SUSP: 0.5000 mL | Freq: Once | INTRAMUSCULAR | Status: DC

## 2018-05-08 MED ORDER — LIDOCAINE HCL (PF) 1 % IJ SOLN
INTRAMUSCULAR | Status: AC
  Filled 2018-05-08: qty 30

## 2018-05-08 MED ORDER — WITCH HAZEL-GLYCERIN EX PADS: 1.0000 "application " | MEDICATED_PAD | CUTANEOUS | Status: DC | PRN

## 2018-05-08 MED ORDER — ONDANSETRON HCL 4 MG PO TABS: 4.0000 mg | ORAL_TABLET | ORAL | Status: DC | PRN

## 2018-05-08 NOTE — Lactation Note (Signed)
This note was copied from a baby's chart. Lactation Consultation Note  Patient Name: Stacey Ayala Today's Date: 05/08/2018 Reason for consult: Initial assessment;1st time breastfeeding;Term P1, 16 hours old female infant. Per mom, she  did not attend any BF classes in pregnancy. Per mom, infant latched well after delivery but not well since delivery, "He just hold  breast in mouth."  LC enter room , mom was attempting to latch infant to breast , LC noticed mom t was not supporting  breast or supporting infant's   head nor using pillows for support.  LC w/ permission from mom, placed pillows behind mom's back, and pillows under  baby and had mom to sit in up right position. Mom tickle baby top lip to stimulate rooting reflex and  Mom held breast in "C" position,  mom waited until infant's  Mouth was open  w/ wide gape, " like biting into an apple". Mom latched infant on left breast in cross-cradle position, LC extended infant's lower jaw down for a wider latch and audible swallows were  Heard by LC. Latch-8. Mom felt confident with the latch and  feeding, infant fed for 15 minutes. LC discussed the importance of STS w/ Mom and baby. Mom demonstrated hand expression, breast massage and [facility].   LC discussed I&O. Mom will feed Infant according to  hunger cues, 8 to 12 times within 24 hours including nights. Mom made aware of O/P services, breastfeeding support groups, community resources, and our phone # for post-discharge questions.  Parents have LC# to call if they have any more questions or concerns.  Maternal Data  Formula Feeding for Exclusion: No Has patient been taught Hand Expression?: Yes(Mom demostrated hand expression and colostrum present.) Does the patient have breastfeeding experience prior to this delivery?: No(Per mom, did not attend any BF classes but has Family support and friend who BF.)  Feeding Feeding Type: Breast Fed Length of feed: 15  min  LATCH Score Latch: Repeated attempts needed to sustain latch, nipple held in mouth throughout feeding, stimulation needed to elicit sucking reflex.  Audible Swallowing: Spontaneous and intermittent  Type of Nipple: Everted at rest and after stimulation  Comfort (Breast/Nipple): Soft / non-tender  Hold (Positioning): Assistance needed to correctly position infant at breast and maintain latch.  LATCH Score: 8  Interventions Interventions: Breast feeding basics reviewed;Assisted with latch;Skin to skin;Breast massage;Support pillows;Adjust position;Breast compression;Hand express;Position options  Lactation Tools Discussed/Used WIC Program: No(Per mom, over -income.)   Consult Status Consult Status: Follow-up Date: 05/09/18 Follow-up type: In-patient    Stacey Ayala 05/08/2018, 8:57 PM

## 2018-05-08 NOTE — Anesthesia Postprocedure Evaluation (Signed)
Anesthesia Post Note  Patient: Stacey Ayala  Procedure(s) Performed: AN AD HOC LABOR EPIDURAL     Patient location during evaluation: Mother Baby Anesthesia Type: Epidural Level of consciousness: awake and alert Pain management: pain level controlled Vital Signs Assessment: post-procedure vital signs reviewed and stable Respiratory status: spontaneous breathing, nonlabored ventilation and respiratory function stable Cardiovascular status: stable Postop Assessment: no headache, no backache, epidural receding, no apparent nausea or vomiting, able to ambulate, patient able to bend at knees and adequate PO intake Anesthetic complications: no    Last Vitals:  Vitals:   05/08/18 0546 05/08/18 0629  BP: 106/86 115/70  Pulse: 85 91  Resp: 16   Temp:  (!) 36.3 C  SpO2:  99%    Last Pain:  Vitals:   05/08/18 0629  TempSrc: Oral  PainSc:    Pain Goal:                 Laban EmperorMalinova,Ragena Fiola Hristova

## 2018-05-09 ENCOUNTER — Ambulatory Visit: Payer: Self-pay

## 2018-05-09 MED ORDER — IBUPROFEN 800 MG PO TABS: 800.0000 mg | ORAL_TABLET | Freq: Three times a day (TID) | ORAL | 0 refills | Status: AC

## 2018-05-09 MED ORDER — OXYCODONE-ACETAMINOPHEN 5-325 MG PO TABS
1.0000 | ORAL_TABLET | ORAL | Status: DC | PRN
  Administered 2018-05-09: 1 via ORAL
  Filled 2018-05-09: qty 1

## 2018-05-09 MED ORDER — MEASLES, MUMPS & RUBELLA VAC ~~LOC~~ INJ
0.5000 mL | INJECTION | Freq: Once | SUBCUTANEOUS | Status: AC
  Administered 2018-05-09: 0.5 mL via SUBCUTANEOUS
  Filled 2018-05-09: qty 0.5

## 2018-05-09 NOTE — Progress Notes (Signed)
PPD 1 SVD with 2nd degree  S:  Reports feeling well - desires official DC to room-in             Tolerating po/ No nausea or vomiting             Bleeding is light             Pain controlled with Motrin             Up ad lib / ambulatory / voiding QS  Newborn Breast / remain inpatient x 5 days due to Klonopin exposure)  O: VS: BP 108/66 (BP Location: Right Arm)   Pulse 80   Temp 98 F (36.7 C) (Oral)   Resp 18   Ht _0  (1.753 m)   Wt 93 kg   LMP 07/28/2017   SpO2 98%   Breastfeeding? Unknown   BMI 30.27 kg/m   LABS:             Recent Labs    05/07/18 0900 05/08/18 0801  WBC 14.5* 22.1*  HGB 13.3 11.1*  PLT 329 265               Blood type: --/--/O POS, O POS Performed at [facility], [address], [place], [place] [address]  (08/06 0900)  Rubella: Nonimmune (01/04 0000)  - offer MMR booster prior to DC                             Physical Exam:             Alert and oriented X3  Abdomen: soft, non-tender, non-distended              Fundus: firm, non-tender, Ueven  Perineum: ice pack in place  Lochia: light to moderate  Extremities: trace edema, no calf pain or tenderness    A: PPD # 1 SVD wth 2nd degree   Doing well - stable status  P: Routine post partum orders  DC - newborn to remain x 5 days with mother rooming-in for breastfeeding  Artelia Laroche CNM, MSN, [facility] 05/09/2018, 9:53 AM

## 2018-05-09 NOTE — Lactation Note (Signed)
This note was copied from a baby's chart. Lactation Consultation Note;  Mother reports that infant is feeding well . She reports that he has been fussy since his circumcision.  Mother is concerned that she doesn't have milk yet. Observed mother hand expressing large drops of colostrum.   Infant latched on when I arrived in the room. Mother reports that she has slight discomfort.  Advised mother to make sure infants lips were flanged wide. Assist with adjusting infants lips.   Discussed milk transitioning and milk coming to volume. Encouraged mother to continue to hand express frequently. Advised mother to continue to cue base feed and feed infant 8-12 times in 24 hours.  Discussed cluster feeding.  Mother is anxious about not being able to console infant when crying . Suggestions given to parents on soothing infant.  Mother to page staff nurse if she is unable to console infant and needs assistance.    Patient Name: Stacey Tildon HuskyBrittany Ayala ZOXWR'UToday's Date: 05/09/2018 Reason for consult: Follow-up assessment   Maternal Data    Feeding Feeding Type: Breast Fed  LATCH Score Latch: Grasps breast easily, tongue down, lips flanged, rhythmical sucking.  Audible Swallowing: A few with stimulation  Type of Nipple: Everted at rest and after stimulation  Comfort (Breast/Nipple): Soft / non-tender  Hold (Positioning): No assistance needed to correctly position infant at breast.  LATCH Score: 9  Interventions Interventions: Skin to skin;Breast compression  Lactation Tools Discussed/Used     Consult Status Consult Status: Follow-up Date: 05/10/18 Follow-up type: In-patient    Stevan BornKendrick, [facility] 05/09/2018, 2:53 PM

## 2018-05-09 NOTE — Progress Notes (Signed)
CSW acknowledges consult and completed clinical assessment.  Clinical documentation will follow.   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  MOB was not a good historian and was unable to communicate the dates of usage or the amounts.  CSW made MOB aware that CSW will need to shared information with pediatrician for medical purposes.  MOB was tearful and requested that pediatrician not converse with MOB about MOB's substance use during pregnancy while FOB is present.    CSW updated [facility] (Dr. Keiffer) There are no barriers to d/c.  Breeonna Mone Boyd-Gilyard, MSW, LCSW Clinical Social Work [phone]    

## 2018-05-09 NOTE — Discharge Summary (Signed)
Obstetric Discharge Summary Reason for Admission: onset of labor Prenatal Procedures: none Intrapartum Procedures: spontaneous vaginal delivery, GBS prophylaxis and Epidural Postpartum Procedures: none Complications-Operative and Postpartum: 2nd degree perineal laceration Hemoglobin  Date Value Ref Range Status  05/08/2018 11.1 (L) 12.0 - 15.0 g/dL Final   HCT  Date Value Ref Range Status  05/08/2018 32.1 (L) 36.0 - 46.0 % Final    Physical Exam:  General: alert, cooperative and no distress Lochia: appropriate Uterine Fundus: firm Incision: healing well DVT Evaluation: No evidence of DVT seen on physical exam.  Discharge Diagnoses: Term Pregnancy-delivered and PPD 2 s/p SVD  Discharge Information: Date: 05/09/2018 Activity: pelvic rest Diet: routine Medications: PNV, Ibuprofen and Klonopin Condition: stable Instructions: refer to practice specific booklet Discharge to: home Follow-up Information    Stacey Ayala, Richard, MD. Schedule an appointment as soon as possible for a visit in 6 week(s).   Specialty:  Obstetrics and Gynecology Why:  home visitn nurse at 1 week Contact information: [address] [place] [id number] [phone]           Newborn Data: Live born female  Birth Weight: 7 lb 11.8 oz (3510 g) APGAR: 8, 9  Newborn Delivery   Birth date/time:  05/08/2017 04:31:00 Delivery type:  Vaginal, Spontaneous     Home with mother.  Stacey Ayala 05/09/2018, 11:45 AM

## 2018-05-10 ENCOUNTER — Ambulatory Visit: Payer: Self-pay

## 2018-05-10 NOTE — Lactation Note (Signed)
This note was copied from a baby's chart. Lactation Consultation Note  Patient Name: Stacey Ayala ZOXWR'UToday's Date: 05/10/2018 Reason for consult: Follow-up assessment;Term  P1 mother whose infant is now 6451 hours old.  Baby has a 9% weight loss.    Mother holding baby as I arrived.  Her breasts are soft and non tender and nipples are erect.  She had no questions/concerns regarding breastfeeding but is looking forward to her milk CTV.  Discussed feedings, CTV, engorgement prevention/treatment, use of EBM and coconut oil for nipples and areolas.  Mother will continue to feed 8-12 times/24 hours or sooner if baby shows cues.  Mother familiar with hand expression and spoon feeding and will feed back any EBM she obtains with hand expression.  Mother does not have a DEBP but is planning on calling her insurance company today.  Manual pump provided with instructions for use.  Flange #24 appropriate at this time but will also provide a #27 since she is close to needing a larger size.  Instructed to call OP number for questions/concerns after discharge.  Father present.     Maternal Data Formula Feeding for Exclusion: No Has patient been taught Hand Expression?: Yes Does the patient have breastfeeding experience prior to this delivery?: No  Feeding    LATCH Score                   Interventions    Lactation Tools Discussed/Used Pump Review: Setup, frequency, and cleaning;Milk Storage Initiated by:: Jaleeah Slight Date initiated:: 05/10/18   Consult Status Consult Status: Complete Date: 05/10/18 Follow-up type: Call as needed    Stacey Ayala 05/10/2018, 8:11 AM

## 2018-06-13 ENCOUNTER — Encounter: Payer: BLUE CROSS/BLUE SHIELD | Admitting: Obstetrics & Gynecology

## 2018-06-19 DIAGNOSIS — Z113 Encounter for screening for infections with a predominantly sexual mode of transmission: Secondary | ICD-10-CM | POA: Diagnosis not present

## 2018-06-19 DIAGNOSIS — N898 Other specified noninflammatory disorders of vagina: Secondary | ICD-10-CM | POA: Diagnosis not present

## 2018-06-19 DIAGNOSIS — Z124 Encounter for screening for malignant neoplasm of cervix: Secondary | ICD-10-CM | POA: Diagnosis not present

## 2018-06-19 DIAGNOSIS — Z23 Encounter for immunization: Secondary | ICD-10-CM | POA: Diagnosis not present

## 2018-09-12 IMAGING — CT CT HEAD W/O CM
3 of 4 series · 15 of 47 positions shown, 18 images · non-contrast
Comparison: None.

CLINICAL DATA: Blurred vision 1 day prior. Left-sided numbness and
right-sided headache. Nausea.

EXAM:
CT HEAD WITHOUT CONTRAST
TECHNIQUE: Contiguous axial images were obtained from the base of the skull
through the vertex without intravenous contrast.

[Series 2: head w/o · axial · non-contrast · 0.49mm/px · z∈[-80,+40]mm · 9 of 30 slices shown, 12 images]
[im 3/30  brain]
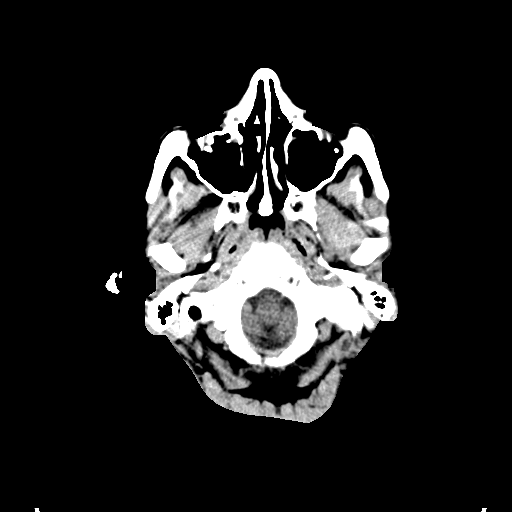
[im 3/30  bone]
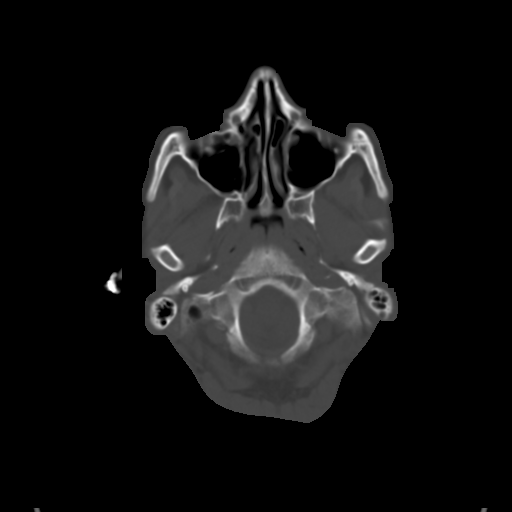
[im 7/30  brain]
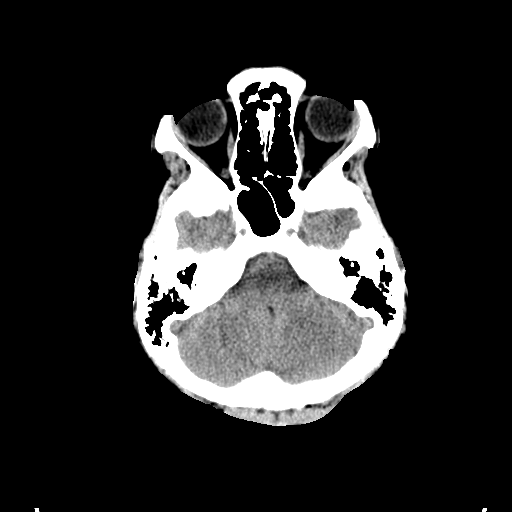
[im 9/30  brain]
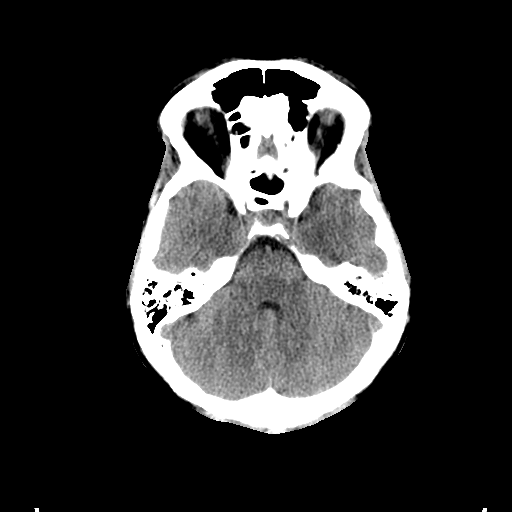
[im 13/30  brain]
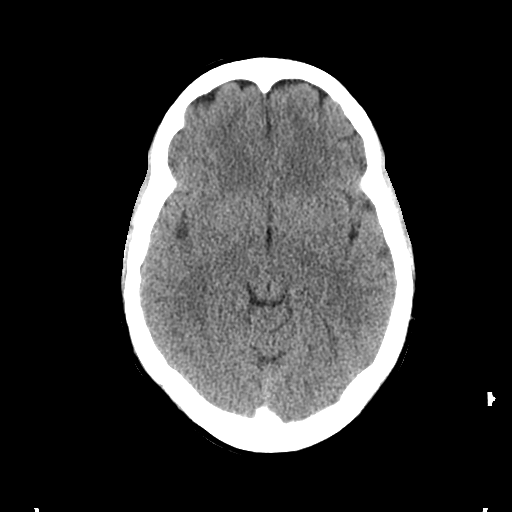
[im 15/30  brain]
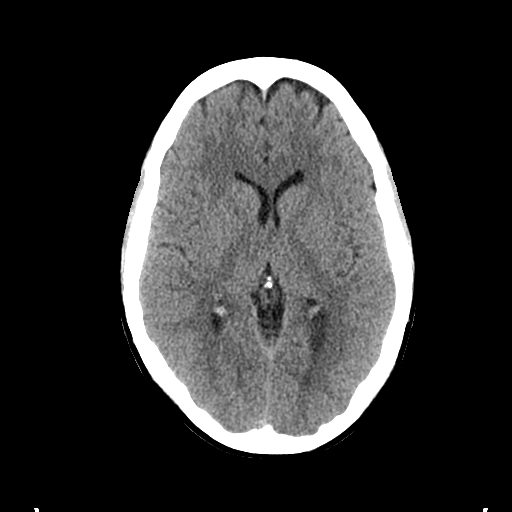
[im 15/30  bone]
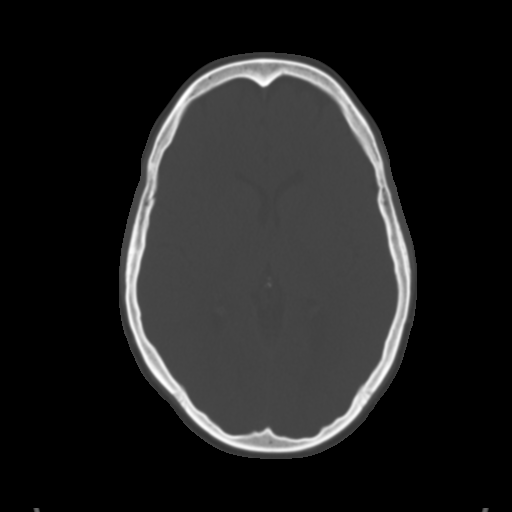
[im 17/30  brain]
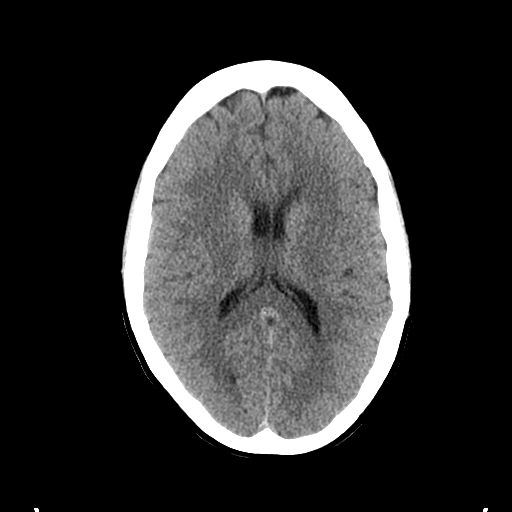
[im 21/30  brain]
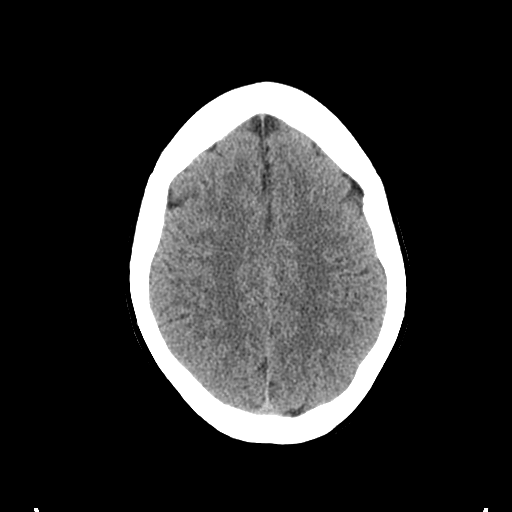
[im 23/30  brain]
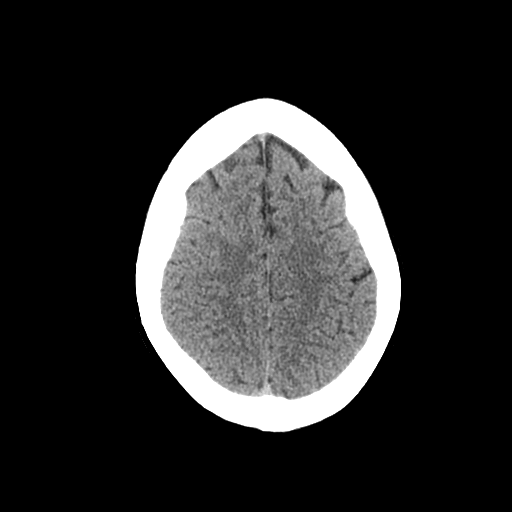
[im 27/30  brain]
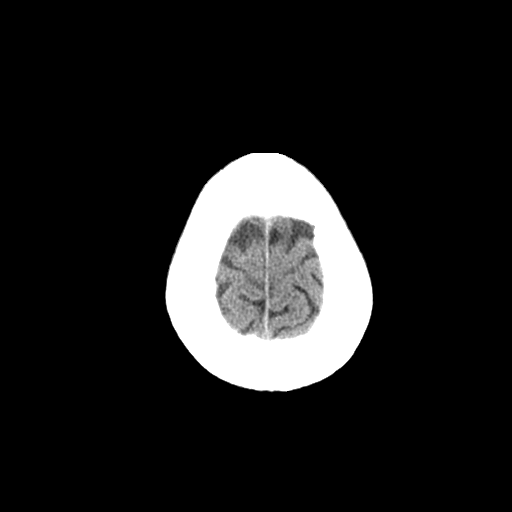
[im 27/30  bone]
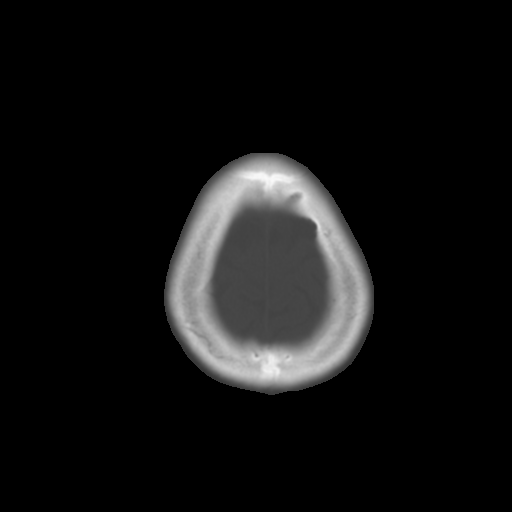

[Series 4: coronal · coronal · 0.29mm/px · 3 of 76 slices shown]
[im 26/76  brain]
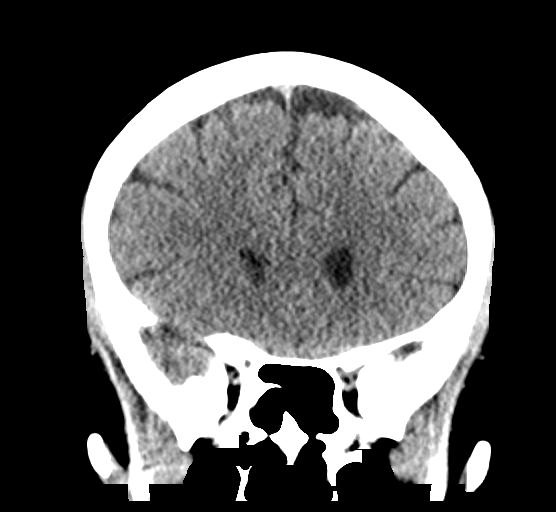
[im 34/76  brain]
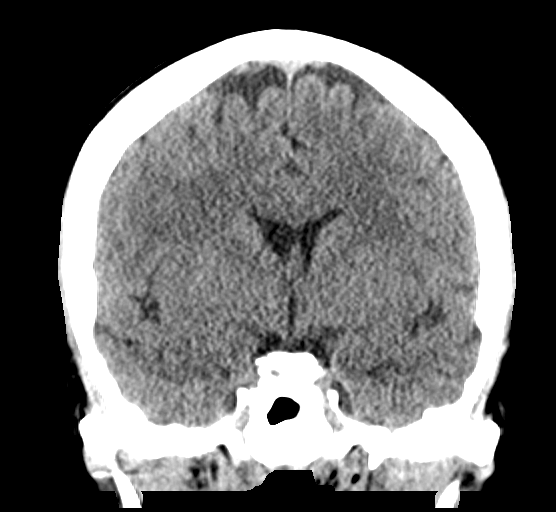
[im 42/76  brain]
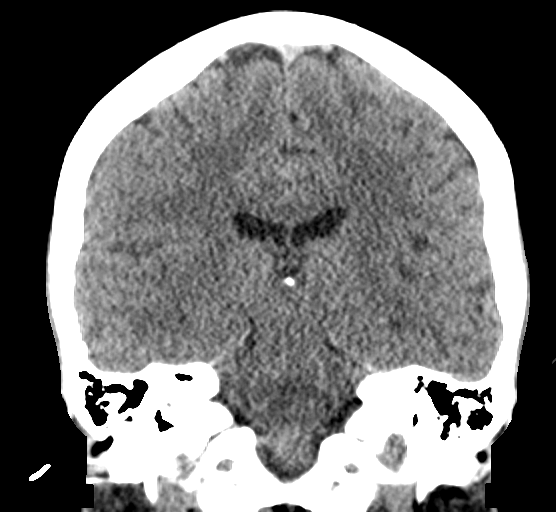

[Series 5: sagittal · sagittal · 0.31mm/px · 3 of 60 slices shown]
[im 20/60  brain]
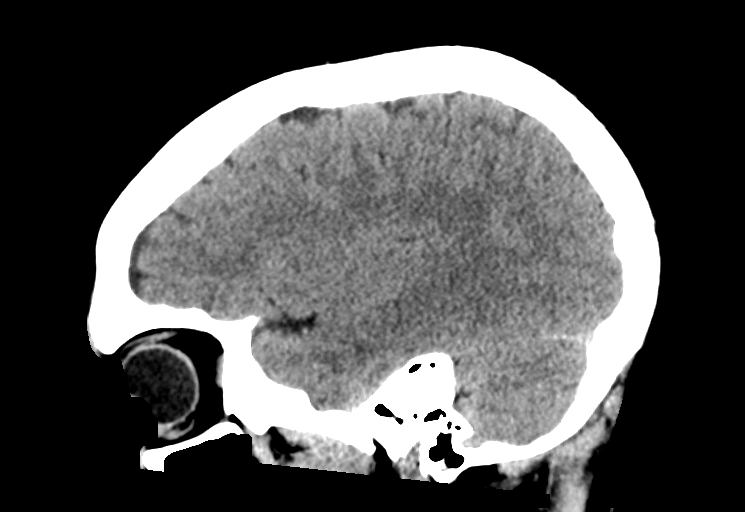
[im 30/60  brain]
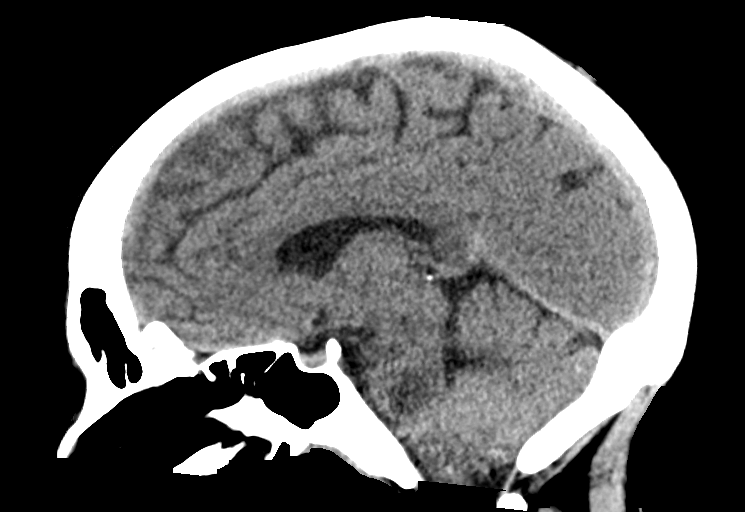
[im 40/60  brain]
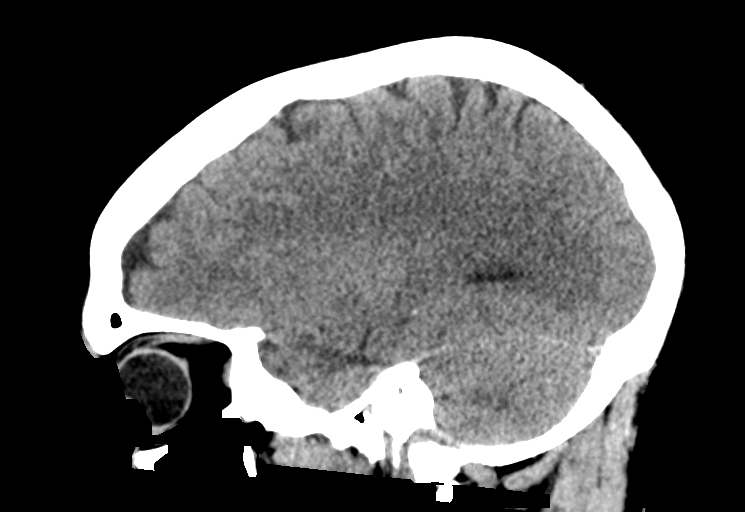

[15 of 47 positions shown; findings below may reference images not displayed]

FINDINGS: Brain: The ventricles are normal in size and configuration. There is
no intracranial mass, hemorrhage, extra-axial fluid collection, or
midline shift. Gray-white compartments appear normal. No acute
infarct evident.

Vascular: There is no demonstrable hyperdense vessel. No vascular
calcifications are appreciable.

Skull: Bony calvarium appears intact.

Sinuses/Orbits: Visualized paranasal sinuses are clear. Orbits
appear symmetric bilaterally.

Other: Mastoid air cells are clear.
IMPRESSION: Study within normal limits.

## 2018-11-27 DIAGNOSIS — F1320 Sedative, hypnotic or anxiolytic dependence, uncomplicated: Secondary | ICD-10-CM | POA: Diagnosis not present

## 2018-11-27 DIAGNOSIS — F411 Generalized anxiety disorder: Secondary | ICD-10-CM | POA: Diagnosis not present

## 2018-11-27 DIAGNOSIS — F410 Panic disorder [episodic paroxysmal anxiety] without agoraphobia: Secondary | ICD-10-CM | POA: Diagnosis not present

## 2018-12-03 DIAGNOSIS — F411 Generalized anxiety disorder: Secondary | ICD-10-CM | POA: Diagnosis not present

## 2018-12-09 DIAGNOSIS — F411 Generalized anxiety disorder: Secondary | ICD-10-CM | POA: Diagnosis not present

## 2018-12-09 DIAGNOSIS — F4312 Post-traumatic stress disorder, chronic: Secondary | ICD-10-CM | POA: Diagnosis not present

## 2018-12-11 DIAGNOSIS — F411 Generalized anxiety disorder: Secondary | ICD-10-CM | POA: Diagnosis not present

## 2018-12-11 DIAGNOSIS — F4312 Post-traumatic stress disorder, chronic: Secondary | ICD-10-CM | POA: Diagnosis not present

## 2018-12-18 DIAGNOSIS — F4312 Post-traumatic stress disorder, chronic: Secondary | ICD-10-CM | POA: Diagnosis not present

## 2018-12-18 DIAGNOSIS — F411 Generalized anxiety disorder: Secondary | ICD-10-CM | POA: Diagnosis not present

## 2018-12-25 DIAGNOSIS — F411 Generalized anxiety disorder: Secondary | ICD-10-CM | POA: Diagnosis not present

## 2018-12-25 DIAGNOSIS — F4312 Post-traumatic stress disorder, chronic: Secondary | ICD-10-CM | POA: Diagnosis not present

## 2019-01-01 DIAGNOSIS — F411 Generalized anxiety disorder: Secondary | ICD-10-CM | POA: Diagnosis not present

## 2019-01-01 DIAGNOSIS — F4312 Post-traumatic stress disorder, chronic: Secondary | ICD-10-CM | POA: Diagnosis not present

## 2019-01-08 DIAGNOSIS — F411 Generalized anxiety disorder: Secondary | ICD-10-CM | POA: Diagnosis not present

## 2019-01-08 DIAGNOSIS — F4312 Post-traumatic stress disorder, chronic: Secondary | ICD-10-CM | POA: Diagnosis not present

## 2019-01-15 DIAGNOSIS — F411 Generalized anxiety disorder: Secondary | ICD-10-CM | POA: Diagnosis not present

## 2019-01-15 DIAGNOSIS — F4312 Post-traumatic stress disorder, chronic: Secondary | ICD-10-CM | POA: Diagnosis not present

## 2019-01-29 DIAGNOSIS — F411 Generalized anxiety disorder: Secondary | ICD-10-CM | POA: Diagnosis not present

## 2019-01-29 DIAGNOSIS — F4312 Post-traumatic stress disorder, chronic: Secondary | ICD-10-CM | POA: Diagnosis not present

## 2019-02-12 DIAGNOSIS — F411 Generalized anxiety disorder: Secondary | ICD-10-CM | POA: Diagnosis not present

## 2019-02-12 DIAGNOSIS — F4312 Post-traumatic stress disorder, chronic: Secondary | ICD-10-CM | POA: Diagnosis not present
